# Patient Record
Sex: Male | Born: 1960 | Race: White | Hispanic: No | Marital: Single | Smoking: Current every day smoker
Health system: Southern US, Community
[De-identification: ages and names within clinical notes are randomized; demographics above are authoritative.]

## PROBLEM LIST (undated history)

## (undated) DIAGNOSIS — I10 Essential (primary) hypertension: Secondary | ICD-10-CM

## (undated) DIAGNOSIS — I639 Cerebral infarction, unspecified: Secondary | ICD-10-CM

## (undated) DIAGNOSIS — J449 Chronic obstructive pulmonary disease, unspecified: Secondary | ICD-10-CM

## (undated) DIAGNOSIS — F1910 Other psychoactive substance abuse, uncomplicated: Secondary | ICD-10-CM

## (undated) HISTORY — PX: APPENDECTOMY: SHX54

---

## 2005-08-07 ENCOUNTER — Emergency Department: Payer: Self-pay | Admitting: Emergency Medicine

## 2008-10-25 ENCOUNTER — Inpatient Hospital Stay: Payer: Self-pay | Admitting: Internal Medicine

## 2008-12-05 ENCOUNTER — Emergency Department: Payer: Self-pay | Admitting: Emergency Medicine

## 2009-11-15 ENCOUNTER — Encounter: Payer: Self-pay | Admitting: Family Medicine

## 2009-11-26 ENCOUNTER — Encounter: Payer: Self-pay | Admitting: Family Medicine

## 2009-12-02 ENCOUNTER — Inpatient Hospital Stay: Payer: Self-pay | Admitting: Internal Medicine

## 2009-12-26 ENCOUNTER — Encounter: Payer: Self-pay | Admitting: Family Medicine

## 2010-01-26 ENCOUNTER — Encounter: Payer: Self-pay | Admitting: Family Medicine

## 2010-02-26 ENCOUNTER — Encounter: Payer: Self-pay | Admitting: Family Medicine

## 2011-01-22 ENCOUNTER — Emergency Department: Payer: Self-pay | Admitting: *Deleted

## 2012-11-09 ENCOUNTER — Emergency Department: Payer: Self-pay

## 2012-11-09 LAB — DRUG SCREEN, URINE
Barbiturates, Ur Screen: NEGATIVE (ref ?–200)
Methadone, Ur Screen: NEGATIVE (ref ?–300)
Opiate, Ur Screen: NEGATIVE (ref ?–300)
Tricyclic, Ur Screen: NEGATIVE (ref ?–1000)

## 2012-11-09 LAB — HEPATIC FUNCTION PANEL A (ARMC)
Albumin: 3.9 g/dL (ref 3.4–5.0)
Bilirubin, Direct: <0.1 mg/dL (ref 0.00–0.20)
Bilirubin,Total: 0.3 mg/dL (ref 0.2–1.0)
SGPT (ALT): 36 U/L (ref 12–78)
Total Protein: 6.7 g/dL (ref 6.4–8.2)

## 2012-11-09 LAB — CBC
HCT: 48.1 % (ref 40.0–52.0)
MCH: 31 pg (ref 26.0–34.0)
MCHC: 34.9 g/dL (ref 32.0–36.0)
RBC: 5.42 10*6/uL (ref 4.40–5.90)
RDW: 13.9 % (ref 11.5–14.5)

## 2012-11-09 LAB — BASIC METABOLIC PANEL
Calcium, Total: 8.6 mg/dL (ref 8.5–10.1)
Chloride: 108 mmol/L — ABNORMAL HIGH (ref 98–107)
Creatinine: 1.09 mg/dL (ref 0.60–1.30)
EGFR (African American): >60
EGFR (Non-African Amer.): >60
Glucose: 112 mg/dL — ABNORMAL HIGH (ref 65–99)
Sodium: 137 mmol/L (ref 136–145)

## 2012-11-09 LAB — URINALYSIS, COMPLETE
Bilirubin,UR: NEGATIVE
Glucose,UR: NEGATIVE mg/dL (ref 0–75)
Ketone: NEGATIVE
Leukocyte Esterase: NEGATIVE
Nitrite: NEGATIVE
Ph: 6 (ref 4.5–8.0)
Specific Gravity: 1.025 (ref 1.003–1.030)
WBC UR: 1 /HPF (ref 0–5)

## 2012-11-09 LAB — TROPONIN I: Troponin-I: <0.02 ng/mL

## 2012-11-09 LAB — ETHANOL: Ethanol: <3 mg/dL

## 2014-01-16 ENCOUNTER — Inpatient Hospital Stay: Payer: Self-pay | Admitting: Orthopedic Surgery

## 2014-01-16 LAB — BASIC METABOLIC PANEL
Anion Gap: 7 (ref 7–16)
BUN: 14 mg/dL (ref 7–18)
CALCIUM: 8.8 mg/dL (ref 8.5–10.1)
CO2: 25 mmol/L (ref 21–32)
Chloride: 106 mmol/L (ref 98–107)
Creatinine: 1.31 mg/dL — ABNORMAL HIGH (ref 0.60–1.30)
EGFR (African American): >60
EGFR (Non-African Amer.): >60
GLUCOSE: 120 mg/dL — AB (ref 65–99)
OSMOLALITY: 277 (ref 275–301)
Potassium: 3.9 mmol/L (ref 3.5–5.1)
Sodium: 138 mmol/L (ref 136–145)

## 2014-01-16 LAB — CBC WITH DIFFERENTIAL/PLATELET
BASOS PCT: 1.2 %
Basophil #: 0.2 10*3/uL — ABNORMAL HIGH (ref 0.0–0.1)
EOS PCT: 0.7 %
Eosinophil #: 0.1 10*3/uL (ref 0.0–0.7)
HCT: 51.1 % (ref 40.0–52.0)
HGB: 17.1 g/dL (ref 13.0–18.0)
LYMPHS ABS: 1.8 10*3/uL (ref 1.0–3.6)
Lymphocyte %: 13.5 %
MCH: 30.7 pg (ref 26.0–34.0)
MCHC: 33.4 g/dL (ref 32.0–36.0)
MCV: 92 fL (ref 80–100)
MONO ABS: 1 x10 3/mm (ref 0.2–1.0)
Monocyte %: 7.3 %
NEUTROS ABS: 10.4 10*3/uL — AB (ref 1.4–6.5)
Neutrophil %: 77.3 %
PLATELETS: 227 10*3/uL (ref 150–440)
RBC: 5.56 10*6/uL (ref 4.40–5.90)
RDW: 13.5 % (ref 11.5–14.5)
WBC: 13.5 10*3/uL — ABNORMAL HIGH (ref 3.8–10.6)

## 2014-01-16 LAB — APTT: Activated PTT: 33.2 secs (ref 23.6–35.9)

## 2014-01-16 LAB — PROTIME-INR
INR: 1
PROTHROMBIN TIME: 13.3 s (ref 11.5–14.7)

## 2014-01-17 LAB — DRUG SCREEN, URINE
Amphetamines, Ur Screen: NEGATIVE (ref ?–1000)
Barbiturates, Ur Screen: NEGATIVE (ref ?–200)
Benzodiazepine, Ur Scrn: NEGATIVE (ref ?–200)
COCAINE METABOLITE, UR ~~LOC~~: POSITIVE (ref ?–300)
Cannabinoid 50 Ng, Ur ~~LOC~~: NEGATIVE (ref ?–50)
MDMA (Ecstasy)Ur Screen: NEGATIVE (ref ?–500)
METHADONE, UR SCREEN: NEGATIVE (ref ?–300)
Opiate, Ur Screen: POSITIVE (ref ?–300)
Phencyclidine (PCP) Ur S: NEGATIVE (ref ?–25)
TRICYCLIC, UR SCREEN: NEGATIVE (ref ?–1000)

## 2014-01-18 LAB — BASIC METABOLIC PANEL
Anion Gap: 7 (ref 7–16)
BUN: 8 mg/dL (ref 7–18)
CHLORIDE: 105 mmol/L (ref 98–107)
Calcium, Total: 8.2 mg/dL — ABNORMAL LOW (ref 8.5–10.1)
Co2: 28 mmol/L (ref 21–32)
Creatinine: 1.11 mg/dL (ref 0.60–1.30)
EGFR (African American): >60
EGFR (Non-African Amer.): >60
Glucose: 124 mg/dL — ABNORMAL HIGH (ref 65–99)
Osmolality: 279 (ref 275–301)
POTASSIUM: 4.1 mmol/L (ref 3.5–5.1)
Sodium: 140 mmol/L (ref 136–145)

## 2014-01-19 LAB — BASIC METABOLIC PANEL
Anion Gap: 4 — ABNORMAL LOW (ref 7–16)
BUN: 11 mg/dL (ref 7–18)
CHLORIDE: 104 mmol/L (ref 98–107)
Calcium, Total: 8.3 mg/dL — ABNORMAL LOW (ref 8.5–10.1)
Co2: 29 mmol/L (ref 21–32)
Creatinine: 1.29 mg/dL (ref 0.60–1.30)
EGFR (Non-African Amer.): >60
GLUCOSE: 135 mg/dL — AB (ref 65–99)
OSMOLALITY: 275 (ref 275–301)
Potassium: 3.8 mmol/L (ref 3.5–5.1)
Sodium: 137 mmol/L (ref 136–145)

## 2014-01-19 LAB — PLATELET COUNT: PLATELETS: 184 10*3/uL (ref 150–440)

## 2014-01-20 LAB — DRUG SCREEN, URINE
Amphetamines, Ur Screen: NEGATIVE (ref ?–1000)
BENZODIAZEPINE, UR SCRN: NEGATIVE (ref ?–200)
Barbiturates, Ur Screen: NEGATIVE (ref ?–200)
COCAINE METABOLITE, UR ~~LOC~~: NEGATIVE (ref ?–300)
Cannabinoid 50 Ng, Ur ~~LOC~~: NEGATIVE (ref ?–50)
MDMA (Ecstasy)Ur Screen: NEGATIVE (ref ?–500)
Methadone, Ur Screen: NEGATIVE (ref ?–300)
Opiate, Ur Screen: POSITIVE (ref ?–300)
PHENCYCLIDINE (PCP) UR S: NEGATIVE (ref ?–25)
Tricyclic, Ur Screen: NEGATIVE (ref ?–1000)

## 2014-01-22 LAB — BASIC METABOLIC PANEL
Anion Gap: 5 — ABNORMAL LOW (ref 7–16)
BUN: 15 mg/dL (ref 7–18)
Calcium, Total: 8.4 mg/dL — ABNORMAL LOW (ref 8.5–10.1)
Chloride: 110 mmol/L — ABNORMAL HIGH (ref 98–107)
Co2: 24 mmol/L (ref 21–32)
Creatinine: 0.95 mg/dL (ref 0.60–1.30)
EGFR (African American): >60
EGFR (Non-African Amer.): >60
Glucose: 101 mg/dL — ABNORMAL HIGH (ref 65–99)
Osmolality: 279 (ref 275–301)
Potassium: 4 mmol/L (ref 3.5–5.1)
SODIUM: 139 mmol/L (ref 136–145)

## 2014-01-22 LAB — CBC WITH DIFFERENTIAL/PLATELET
BASOS ABS: 0.1 10*3/uL (ref 0.0–0.1)
Basophil %: 1 %
EOS ABS: 0.2 10*3/uL (ref 0.0–0.7)
Eosinophil %: 2.1 %
HCT: 44.8 % (ref 40.0–52.0)
HGB: 15 g/dL (ref 13.0–18.0)
Lymphocyte #: 1.7 10*3/uL (ref 1.0–3.6)
Lymphocyte %: 15.2 %
MCH: 30.7 pg (ref 26.0–34.0)
MCHC: 33.5 g/dL (ref 32.0–36.0)
MCV: 92 fL (ref 80–100)
MONOS PCT: 11.7 %
Monocyte #: 1.3 x10 3/mm — ABNORMAL HIGH (ref 0.2–1.0)
Neutrophil #: 7.6 10*3/uL — ABNORMAL HIGH (ref 1.4–6.5)
Neutrophil %: 70 %
PLATELETS: 219 10*3/uL (ref 150–440)
RBC: 4.89 10*6/uL (ref 4.40–5.90)
RDW: 13.3 % (ref 11.5–14.5)
WBC: 10.9 10*3/uL — AB (ref 3.8–10.6)

## 2014-05-19 NOTE — Consult Note (Signed)
Brief Consult Note: Diagnosis: hypertensive urgency, left trimalleolar fracture.   Patient was seen by consultant.   Consult note dictated.   Recommend to proceed with surgery or procedure.   Orders entered.   Comments: 53yCF pmh htn, med no compliance p/w 01/16/14 after mech fall,  found to have left trimalleolar fracture markedly elevated BP  1. preop eval: orif trimalleolar fracture: moderate risk for moderate risk surgery. METs <4, limited by mobility issues, no active cp, chf, valvular dysfinction, arrhythmia: given BP - goal <180/100 prior to surgery 2. hypertensive urgency: restart lisinopril, add prn hydralazine 3. vte px: svd full 1553m.  Electronic Signatures: Izzak Fries, Cletis Athensavid K (MD)  (Signed 22-Dec-15 23:45)  Authored: Brief Consult Note   Last Updated: 22-Dec-15 23:45 by Wyatt HasteHower, Jaimin Krupka K (MD)

## 2014-05-19 NOTE — Consult Note (Signed)
PATIENT NAME:  Steven [name], Steven [name] DATE OF BIRTH:  06/08/1960  DATE OF CONSULTATION:  01/16/2014  REFERRING PHYSICIAN:  Leitha SchullerMichael J. Menz, MD  CONSULTING PHYSICIAN:  Steven Athensavid K. Hower, MD  REASON FOR CONSULTATION:  Preoperative evaluation.   HISTORY OF PRESENT ILLNESS:  This is a 54 year old Caucasian gentleman with a history of hypertension and poor medical compliance, as he is homeless, takes no medications, presenting on 01/16/2014, after a mechanical fall. He was found to have left-sided trimalleolar fracture and also noted to have markedly elevated blood pressure. He originally fell after slipping on some mud outside and noticed immediate pain over his left ankle, 10 out of 10 in intensity, sharp in quality, worse with walking, and no relieving factors. He was evaluated by the orthopedic service, who actually admitted him and asked for a medical consult for preoperative evaluation.   REVIEW OF SYSTEMS: CONSTITUTIONAL:  Denies fevers, chills, fatigue, or weakness.  EYES:  Denies blurred vision, double vision, or eye pain.  EARS, NOSE, AND THROAT:  Denies tinnitus, ear pain, or hearing loss. RESPIRATORY:  Denies cough, wheeze, or shortness of breath.  CARDIOVASCULAR:  Denies chest pain, palpitations, or edema.  GASTROINTESTINAL:  Denies nausea, vomiting, diarrhea, or abdominal pain.  GENITOURINARY:  Denies dysuria or hematuria.  ENDOCRINE:  Denies nocturia or thyroid problems.  HEMATOLOGIC AND LYMPHATIC:  Denies easy bruising or bleeding. SKIN:  Denies rash or lesions. MUSCULOSKELETAL:  Positive for pain in the left ankle as described above. Otherwise, denies any pain in the neck, back, shoulder, knees, or hips or arthritic symptoms.  NEUROLOGIC:  Denies any paralysis or paresthesias.  PSYCHIATRIC:  Denies anxiety or depressive symptoms.  Otherwise, a full review of systems performed by me is negative.  PAST MEDICAL HISTORY:  CVA with residual ambulatory dysfunction and  essential hypertension, which he is on no medications.   SOCIAL HISTORY:  Positive for everyday tobacco use. Denies any alcohol or drug use.   FAMILY HISTORY:  Positive for diabetes as well as coronary artery disease.   ALLERGIES:  FELDENE AS WELL AS NONSTEROIDAL ANTI-INFLAMMATORY DRUGS.   HOME MEDICATIONS:  Include none. He was previously on lisinopril 20 mg daily, but had stopped taking this a number of months ago.   PHYSICAL EXAMINATION: VITAL SIGNS:  Temperature 99.3, heart rate 100, respirations 18, blood pressure 173/112, saturating 94% on room air. Weight 73.2 kg, BMI 27.7.  GENERAL:  Somewhat disheveled Caucasian gentleman, currently in minimal distress given ankle pain.  HEAD:  Normocephalic, atraumatic.  EYES:  Pupils are equal, round, and reactive to light. Extraocular muscles are intact. No scleral icterus.  MOUTH:  Moist mucosal membranes. Dentition is intact. No abscess noted.  EARS, NOSE, AND THROAT:  Clear without exudates. No external lesions. NECK:  Supple. No thyromegaly. No nodules. No JVD.  PULMONARY:  Clear to auscultation bilaterally without wheezes, rales, or rhonchi. No use of accessory muscles. Good respiratory effort.  CHEST:  Nontender to palpation.  CARDIOVASCULAR:  S1 and S2, regular rate and rhythm. No murmurs, rubs, or gallops. No edema. Pedal pulses are 2+ bilaterally.  GASTROINTESTINAL:  Soft, nontender, nondistended. No masses. Positive bowel sounds. No hepatosplenomegaly. MUSCULOSKELETAL:  There is focal swelling and edema over the left ankle without any ecchymosis. Range of motion is limited in his left ankle secondary to pain; otherwise, range of motion is full in all extremities.  NEUROLOGIC:  Cranial nerves II through XII are intact. No gross focal neurological deficits. Sensation is intact. Reflexes are  intact.  SKIN:  No ulceration, lesions, rash, or cyanosis. Skin is warm and dry. Turgor is intact.  PSYCHIATRIC:  Mood and affect are within normal  limits. The patient is awake, alert, and oriented x 3. Insight and judgment are intact.   LABORATORY DATA:  He had an x-ray of the left ankle revealing acute trimalleolar fracture with mid-lateral subluxation. CT of the left ankle without contrast reveals slightly comminuted, mildly displaced trimalleolar fracture with partial disruption of the interosseous ligament and medial widening of the ankle mortise.   Remainder of laboratory data:  Sodium 130, potassium 3.9, chloride 106, bicarbonate 25, BUN 14, creatinine 1.31, glucose 120. WBC is 13.5, hemoglobin 17.1, and platelets are 227,000. INR is 1.   ASSESSMENT AND PLAN:  This is a 54 year old Caucasian gentleman with history of hypertension, he is on no medications, presenting after a mechanical fall and found to have a left trimalleolar fracture. Medical consult is requested for high blood pressure as well as preoperative evaluation.   1.  Preoperative evaluation for open reduction and internal fixation of left trimalleolar fracture. He should be considered moderate risk for moderate risk surgery from a cardiac standpoint, has METs less than 4 given limited mobility at baseline. He, however, denies any active chest pain or anginal symptoms. No evidence of congestive heart failure, valvular dysfunction, or significant arrhythmias; however, given elevated blood pressure, we will ideally have blood pressure less than 180/100 prior to surgery. As far as medications are concerned, we will restart his prior home medications including lisinopril, provide pain control and a bowel regimen.  2.  Hypertensive urgency. Restart his lisinopril. Add p.r.n. hydralazine as needed for systolic greater than 180 and diastolic greater than 100.  3.  Venous thromboembolism prophylaxis with sequential compression devices.  CODE STATUS:  The patient is a full code.   TIME SPENT:  45 minutes.    ____________________________ Steven [name]. Hower, MD dkh:nb D: 01/16/2014  23:53:32 ET T: 01/17/2014 00:41:10 ET JOB#: [id number]  cc: Steven [name]. Hower, MD, <Dictator> Steven Synetta Shadow MD ELECTRONICALLY SIGNED 01/17/2014 1:49

## 2014-05-19 NOTE — Op Note (Signed)
PATIENT NAME:  Steven SkeetersWELCHER, Clearnce T MR#:  [id number] DATE OF BIRTH:  [birth year]  DATE OF PROCEDURE:  01/20/2014  PREOPERATIVE DIAGNOSIS: Left trimalleolar ankle fracture.   POSTOPERATIVE DIAGNOSIS:  Left trimalleolar ankle fracture.  PROCEDURE: ORIF left medial malleolus and lateral malleolus.   ANESTHESIA: General.   SURGEON: Kennedy BuckerMichael Elah Avellino, MD   DESCRIPTION OF PROCEDURE: The patient was brought to the Operating Room and after adequate anesthesia was obtained, a bump was placed underneath the left buttock. The leg was prepped and draped in the usual sterile fashion with a tourniquet applied to the upper thigh. After patient identification and timeout procedures were completed, the tourniquet was raised.  Lateral approach was made to the distal fibula with exposure of the fracture site.  A 7-hole third tubular plate was contoured to fit the distal fibula.  Proximal screws placed with bicortical fixation of the fibula. This gave good reduction to the shaft fracture. Two syndesmotic screws were placed to aid in maintenance of reduction of the syndesmosis with an additional distal fully threaded cancellous screw in the lateral malleolus.  The fracture was very well aligned at this point with anatomic reduction of the mortise based on intraoperative x-rays.  The wound was thoroughly irrigated and then closed with 3-0 Vicryl subcutaneously and skin staples. Attention was then turned medially and medial approach was made with the fracture site identified. This was drilled, and a 35 mm malleolar screw was inserted which gave good fixation with compression at the fracture site, essentially anatomically reduced. This wound was then irrigated and closed. Permanent x-rays to be taken to the recovery room.  The wound was closed with 3-0 Vicryl and skin staples. Xeroform, 4 x 4, Webril and a short leg cast were applied with the ankle in neutral. Tourniquet was let down at the close of the case. The patient was sent to the  recovery room in stable condition.   ESTIMATED BLOOD LOSS: Minimal.   COMPLICATIONS: None.   SPECIMEN: None.   IMPLANTS:  Biomet small frag, third tubular plate with multiple screws and 35 mm malleolar screw medially.   TOURNIQUET TIME:  40 minutes at 300 mmHg.  COMPLICATIONS:  There were no complications.   SPECIMEN: No specimen.   CONDITION:  To recovery room, stable.     ____________________________ Leitha SchullerMichael J. Andranik Jeune, MD mjm:DT D: 01/20/2014 10:15:22 ET T: 01/20/2014 10:38:15 ET JOB#: [id number]  cc: Leitha SchullerMichael J. Yunis Voorheis, MD, <Dictator> Leitha SchullerMICHAEL J Patsey Pitstick MD ELECTRONICALLY SIGNED 01/21/2014 11:40

## 2014-05-19 NOTE — Consult Note (Signed)
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   Patient was seen by consultant.   Consult note dictated.   Comments: Psychiatry: PAtient seen and chart reviewed and consult dictated. PAtient admits to cocaine abuse but has no intention of stopping despite knowing the dangers. Denies drinking. No evidence of any current withdrawl. No evidence psychosis and denies depression. Clearly has a history of living unsafely but says he prefers it despite the risks. Not commitable.   No futher treatment. Family may want to pursue incompetancy hearing at some point if he continues to take poor care of himself.  Electronic Signatures: Clapacs, Jackquline DenmarkJohn T (MD)  (Signed 29-Dec-15 11:25)  Authored: Brief Consult Note   Last Updated: 29-Dec-15 11:25 by Audery Amellapacs, John T (MD)

## 2014-05-19 NOTE — H&P (Signed)
PATIENT NAME:  Steven SkeetersWELCHER, Markie T MR#:  [id number] DATE OF BIRTH:  01/16/1961  DATE OF ADMISSION:  01/16/2014  CHIEF COMPLAINT: Left ankle pain and deformity.   HISTORY OF PRESENT ILLNESS: The patient is a 54 year old who was on a slope. His foot slipped in the mud and he fell over his ankle. He says it twisted backwards. He was unable to bear weight, and came to the Emergency Room where he was found to have a left ankle trimalleolar fracture. His skin was intact. He was splinted and admitted for treatment of this condition.   He is homeless. He normally walks without assistive device. He does not have regular medical care, despite significant health problems.   PAST MEDICAL HISTORY: Remarkable for prior CVA x 3, hypertension, history of substance abuse, reflux, alcohol abuse.   REVIEW OF SYSTEMS: Positive just for the left ankle pain. He does report some generalized weakness secondary to his prior CVAs.   PHYSICAL EXAMINATION:  GENERAL: Well-developed, well-nourished white male who appears much older than his stated age, in mild distress.  HEENT: Remarkable for him being edentulous.  LUNGS: Clear.  HEART: Regular rate and rhythm.  ABDOMEN: Soft and nontender.  EXTREMITIES: Exam is more pertinent to left lower extremity. He has brisk capillary refill. He is in a splint. He is able to flex and extend the toes, and sensation is intact.   The x-rays and CT confirm trimalleolar ankle fracture with comminution of the fibula and widening of the ankle mortise.   CLINICAL IMPRESSION: Left ankle trimalleolar fracture.   RECOMMENDATION: For open reduction and internal fixation of the medial and lateral malleoli with syndesmotic screw. The risks, benefits, and possible complications were discussed with the patient.   He was very hypertensive when he came in, but that has been normalized with the assistance of the hospitalist.   We will plan on surgery later, sometime on 01/17/2014. I do hope we can  make arrangements for a rehab stay as, if he is discharged to the street, I think it is likely that he will have wound complications.    ____________________________ Leitha SchullerMichael J. Jasline Buskirk, MD mjm:MT D: 01/17/2014 08:01:06 ET T: 01/17/2014 09:02:17 ET JOB#: [id number]  cc: Leitha SchullerMichael J. Kailen Name, MD, <Dictator> Leitha SchullerMICHAEL J Felisha Claytor MD ELECTRONICALLY SIGNED 01/17/2014 12:16

## 2014-05-27 NOTE — Discharge Summary (Signed)
PATIENT NAME:  Steven [name], Steven [name] MR#:  [id number] DATE OF BIRTH:  1960/10/31  DATE OF ADMISSION:  01/16/2014 DATE OF DISCHARGE:  01/23/2014  ADMITTING DIAGNOSIS: Left ankle trimalleolar fracture.   DISCHARGE DIAGNOSIS: Left ankle trimalleolar fracture.   PROCEDURE: Open reduction and internal fixation of left medial malleolus and lateral malleolus.   ANESTHESIA: General.   SURGEON: Leitha SchullerMichael J. Menz, MD   ESTIMATED BLOOD LOSS: Minimal.   COMPLICATIONS: None.   IMPLANTS: Biomet, small fragment, one third tubular plate with multiple screws and 35 mm malleolus screws medially.   TOURNIQUET TIME: 40 minutes at 300 mmHg.   HISTORY OF PRESENT ILLNESS: The patient is a 54 year old who was on his float. His foot slipped in the mud, and he fell over his ankle. He said it twisted backwards. He was unable to bear weight and came to the Emergency Room, where he was found to have a left ankle bimalleolar fracture. Skin was intact. He was splinted and admitted for treatment of this condition.    PHYSICAL EXAMINATION:  GENERAL: Well-developed, well-nourished white male, appears much older than his stated age.  HEENT: Remarkable for him being edentulous.  LUNGS: Clear.  HEART: Regular rate and rhythm.  ABDOMEN: Soft and nontender.  EXTREMITIES: Exam is more pertinent to the left lower extremity. He has brisk capillary refill. He is in a splint. He is able to flex and extend the toes, and sensation is intact.   HOSPITAL COURSE: The patient was admitted to the hospital on 01/16/2014. He presented to the ER. The patient was homeless. He was found to have hypertension and a left-sided trimalleolar ankle fracture.   The patient was evaluated by medicine and he was started on lisinopril and hydralazine p.r.n. hypertension. On 01/17/2014, he was evaluated by orthopedics, where it was determined he would need an ORIF of the left ankle. Urine drug screen was positive for cocaine, so this delayed surgery until  01/20/2014.   The patient was started with physical therapy. He was nonweightbearing on the left lower extremity. On 01/18/2014, the patient was doing well. Pain was controlled. He was having some symptoms consistent with reflux, so he was started on proton pump inhibitor. His hypertension continued, so metoprolol was added. On 01/20/2014, the patient underwent ORIF for a left trimalleolar ankle fracture. On 01/21/2014, the patient did suffer some delirium, which was thought to be from alcohol withdrawals. He was given fluids, potassium and thiamine, and this did improve.   By 01/22/2014, his withdrawal symptoms were improved and he was not having any delirium. Hypertension had resolved. On 01/23/2014, the patient was scheduled for discharge to home with home health.   CONDITION AT DISCHARGE: Stable.   DISCHARGE INSTRUCTIONS: The patient should not put any weight on the affected extremity. Elevate the affected foot or leg on 1-2 pillows so that it is higher than the knee. The patient may resume a regular diet as tolerated. Keep the cast clean and dry. [facility]. The patient has a followup appointment. He was told not to do cocaine or crack. The patient should follow up with Dr. Rosita KeaMenz.   DISCHARGE MEDICATIONS: Please see list of discharge medications on discharge instructions.    ____________________________ [name]. Cranston Neighborhris Gaines, PA-C tcg:MT D: 01/30/2014 17:15:56 ET [name]: 01/30/2014 17:51:35 ET JOB#: [id number]  cc: [name]. Cranston Neighborhris Gaines, PA-C, <Dictator> Evon SlackHOMAS C GAINES GeorgiaPA ELECTRONICALLY SIGNED 02/06/2014 8:31

## 2016-04-14 ENCOUNTER — Emergency Department: Payer: Medicaid Other

## 2016-04-14 ENCOUNTER — Observation Stay: Payer: Medicaid Other

## 2016-04-14 ENCOUNTER — Inpatient Hospital Stay
Admission: EM | Admit: 2016-04-14 | Discharge: 2016-04-17 | DRG: 065 | Disposition: A | Discharge status: Home / Self Care | Payer: Medicaid Other | Attending: Internal Medicine | Admitting: Internal Medicine

## 2016-04-14 DIAGNOSIS — G253 Myoclonus: Secondary | ICD-10-CM | POA: Diagnosis present

## 2016-04-14 DIAGNOSIS — E785 Hyperlipidemia, unspecified: Secondary | ICD-10-CM | POA: Diagnosis present

## 2016-04-14 DIAGNOSIS — K224 Dyskinesia of esophagus: Secondary | ICD-10-CM | POA: Diagnosis present

## 2016-04-14 DIAGNOSIS — R079 Chest pain, unspecified: Secondary | ICD-10-CM

## 2016-04-14 DIAGNOSIS — Z66 Do not resuscitate: Secondary | ICD-10-CM | POA: Diagnosis present

## 2016-04-14 DIAGNOSIS — Z7189 Other specified counseling: Secondary | ICD-10-CM

## 2016-04-14 DIAGNOSIS — R7989 Other specified abnormal findings of blood chemistry: Secondary | ICD-10-CM

## 2016-04-14 DIAGNOSIS — X58XXXA Exposure to other specified factors, initial encounter: Secondary | ICD-10-CM | POA: Diagnosis not present

## 2016-04-14 DIAGNOSIS — R000 Tachycardia, unspecified: Secondary | ICD-10-CM | POA: Diagnosis present

## 2016-04-14 DIAGNOSIS — Z888 Allergy status to other drugs, medicaments and biological substances status: Secondary | ICD-10-CM

## 2016-04-14 DIAGNOSIS — F1410 Cocaine abuse, uncomplicated: Secondary | ICD-10-CM | POA: Diagnosis present

## 2016-04-14 DIAGNOSIS — Z590 Homelessness: Secondary | ICD-10-CM

## 2016-04-14 DIAGNOSIS — Z9114 Patient's other noncompliance with medication regimen: Secondary | ICD-10-CM

## 2016-04-14 DIAGNOSIS — Z9189 Other specified personal risk factors, not elsewhere classified: Secondary | ICD-10-CM

## 2016-04-14 DIAGNOSIS — I2510 Atherosclerotic heart disease of native coronary artery without angina pectoris: Secondary | ICD-10-CM | POA: Diagnosis present

## 2016-04-14 DIAGNOSIS — T17998A Other foreign object in respiratory tract, part unspecified causing other injury, initial encounter: Secondary | ICD-10-CM | POA: Diagnosis not present

## 2016-04-14 DIAGNOSIS — I10 Essential (primary) hypertension: Secondary | ICD-10-CM | POA: Diagnosis present

## 2016-04-14 DIAGNOSIS — E860 Dehydration: Secondary | ICD-10-CM

## 2016-04-14 DIAGNOSIS — I63412 Cerebral infarction due to embolism of left middle cerebral artery: Principal | ICD-10-CM | POA: Diagnosis present

## 2016-04-14 DIAGNOSIS — Z9119 Patient's noncompliance with other medical treatment and regimen: Secondary | ICD-10-CM

## 2016-04-14 DIAGNOSIS — J441 Chronic obstructive pulmonary disease with (acute) exacerbation: Secondary | ICD-10-CM | POA: Diagnosis present

## 2016-04-14 DIAGNOSIS — R778 Other specified abnormalities of plasma proteins: Secondary | ICD-10-CM

## 2016-04-14 DIAGNOSIS — D72829 Elevated white blood cell count, unspecified: Secondary | ICD-10-CM | POA: Diagnosis present

## 2016-04-14 DIAGNOSIS — Z79899 Other long term (current) drug therapy: Secondary | ICD-10-CM

## 2016-04-14 DIAGNOSIS — Y92230 Patient room in hospital as the place of occurrence of the external cause: Secondary | ICD-10-CM | POA: Diagnosis not present

## 2016-04-14 DIAGNOSIS — N289 Disorder of kidney and ureter, unspecified: Secondary | ICD-10-CM | POA: Diagnosis present

## 2016-04-14 DIAGNOSIS — I639 Cerebral infarction, unspecified: Secondary | ICD-10-CM

## 2016-04-14 DIAGNOSIS — Z515 Encounter for palliative care: Secondary | ICD-10-CM | POA: Diagnosis present

## 2016-04-14 DIAGNOSIS — R066 Hiccough: Secondary | ICD-10-CM | POA: Diagnosis present

## 2016-04-14 DIAGNOSIS — R29898 Other symptoms and signs involving the musculoskeletal system: Secondary | ICD-10-CM

## 2016-04-14 DIAGNOSIS — R1310 Dysphagia, unspecified: Secondary | ICD-10-CM

## 2016-04-14 DIAGNOSIS — F17210 Nicotine dependence, cigarettes, uncomplicated: Secondary | ICD-10-CM | POA: Diagnosis present

## 2016-04-14 DIAGNOSIS — I248 Other forms of acute ischemic heart disease: Secondary | ICD-10-CM | POA: Diagnosis present

## 2016-04-14 DIAGNOSIS — I252 Old myocardial infarction: Secondary | ICD-10-CM

## 2016-04-14 DIAGNOSIS — Z8249 Family history of ischemic heart disease and other diseases of the circulatory system: Secondary | ICD-10-CM

## 2016-04-14 DIAGNOSIS — G458 Other transient cerebral ischemic attacks and related syndromes: Secondary | ICD-10-CM | POA: Diagnosis present

## 2016-04-14 DIAGNOSIS — R200 Anesthesia of skin: Secondary | ICD-10-CM

## 2016-04-14 DIAGNOSIS — I69354 Hemiplegia and hemiparesis following cerebral infarction affecting left non-dominant side: Secondary | ICD-10-CM

## 2016-04-14 HISTORY — DX: Cerebral infarction, unspecified: I63.9

## 2016-04-14 HISTORY — DX: Essential (primary) hypertension: I10

## 2016-04-14 LAB — TROPONIN I
TROPONIN I: 0.03 ng/mL — AB (ref <0.03)
Troponin I: 0.04 ng/mL (ref <0.03)
Troponin I: 0.04 ng/mL (ref <0.03)

## 2016-04-14 LAB — CBC
HCT: 54.5 % — ABNORMAL HIGH (ref 40.0–52.0)
HEMOGLOBIN: 19.1 g/dL — AB (ref 13.0–18.0)
MCH: 30.8 pg (ref 26.0–34.0)
MCHC: 35.1 g/dL (ref 32.0–36.0)
MCV: 87.8 fL (ref 80.0–100.0)
Platelets: 227 10*3/uL (ref 150–440)
RBC: 6.21 MIL/uL — AB (ref 4.40–5.90)
RDW: 14 % (ref 11.5–14.5)
WBC: 13 10*3/uL — AB (ref 3.8–10.6)

## 2016-04-14 LAB — BASIC METABOLIC PANEL
Anion gap: 11 (ref 5–15)
BUN: 24 mg/dL — AB (ref 6–20)
CHLORIDE: 103 mmol/L (ref 101–111)
CO2: 25 mmol/L (ref 22–32)
Calcium: 9.8 mg/dL (ref 8.9–10.3)
Creatinine, Ser: 1.36 mg/dL — ABNORMAL HIGH (ref 0.61–1.24)
GFR calc Af Amer: >60 mL/min (ref >60)
GFR calc non Af Amer: 57 mL/min — ABNORMAL LOW (ref >60)
GLUCOSE: 156 mg/dL — AB (ref 65–99)
POTASSIUM: 3.7 mmol/L (ref 3.5–5.1)
Sodium: 139 mmol/L (ref 135–145)

## 2016-04-14 LAB — HEPATIC FUNCTION PANEL
ALBUMIN: 4.5 g/dL (ref 3.5–5.0)
ALK PHOS: 96 U/L (ref 38–126)
ALT: 14 U/L — ABNORMAL LOW (ref 17–63)
AST: 20 U/L (ref 15–41)
BILIRUBIN TOTAL: 1 mg/dL (ref 0.3–1.2)
Bilirubin, Direct: 0.2 mg/dL (ref 0.1–0.5)
Indirect Bilirubin: 0.8 mg/dL (ref 0.3–0.9)
Total Protein: 8.1 g/dL (ref 6.5–8.1)

## 2016-04-14 LAB — ETHANOL: Alcohol, Ethyl (B): <5 mg/dL (ref <5)

## 2016-04-14 LAB — CK: Total CK: 140 U/L (ref 49–397)

## 2016-04-14 MED ORDER — CHLORHEXIDINE GLUCONATE 0.12 % MT SOLN
15.0000 mL | Freq: Two times a day (BID) | OROMUCOSAL | Status: DC
  Administered 2016-04-14 – 2016-04-16 (×4): 15 mL via OROMUCOSAL
  Filled 2016-04-14 (×5): qty 15

## 2016-04-14 MED ORDER — ONDANSETRON HCL 4 MG PO TABS
4.0000 mg | ORAL_TABLET | Freq: Four times a day (QID) | ORAL | Status: DC | PRN
  Administered 2016-04-14: 4 mg via ORAL
  Filled 2016-04-14: qty 1

## 2016-04-14 MED ORDER — ASPIRIN 81 MG PO CHEW
324.0000 mg | CHEWABLE_TABLET | Freq: Once | ORAL | Status: AC
  Administered 2016-04-14: 324 mg via ORAL
  Filled 2016-04-14: qty 4

## 2016-04-14 MED ORDER — ALBUTEROL SULFATE (2.5 MG/3ML) 0.083% IN NEBU: 2.5000 mg | INHALATION_SOLUTION | RESPIRATORY_TRACT | Status: DC | PRN

## 2016-04-14 MED ORDER — LISINOPRIL 10 MG PO TABS
10.0000 mg | ORAL_TABLET | Freq: Every day | ORAL | Status: DC
  Administered 2016-04-14 – 2016-04-16 (×2): 10 mg via ORAL
  Filled 2016-04-14 (×2): qty 1

## 2016-04-14 MED ORDER — POTASSIUM CHLORIDE IN NACL 20-0.9 MEQ/L-% IV SOLN
INTRAVENOUS | Status: DC
  Administered 2016-04-14 – 2016-04-16 (×3): via INTRAVENOUS
  Filled 2016-04-14 (×8): qty 1000

## 2016-04-14 MED ORDER — ENOXAPARIN SODIUM 40 MG/0.4ML ~~LOC~~ SOLN
40.0000 mg | SUBCUTANEOUS | Status: DC
  Administered 2016-04-14 – 2016-04-16 (×3): 40 mg via SUBCUTANEOUS
  Filled 2016-04-14 (×3): qty 0.4

## 2016-04-14 MED ORDER — SODIUM CHLORIDE 0.9 % IV BOLUS (SEPSIS)
1000.0000 mL | Freq: Once | INTRAVENOUS | Status: AC
  Administered 2016-04-14: 1000 mL via INTRAVENOUS

## 2016-04-14 MED ORDER — PREDNISONE 50 MG PO TABS
50.0000 mg | ORAL_TABLET | Freq: Every day | ORAL | Status: DC
  Administered 2016-04-14: 50 mg via ORAL
  Filled 2016-04-14: qty 1

## 2016-04-14 MED ORDER — IBUPROFEN 400 MG PO TABS: 400.0000 mg | ORAL_TABLET | Freq: Four times a day (QID) | ORAL | Status: DC | PRN

## 2016-04-14 MED ORDER — POLYETHYLENE GLYCOL 3350 17 G PO PACK: 17.0000 g | PACK | Freq: Every day | ORAL | Status: DC | PRN

## 2016-04-14 MED ORDER — ATORVASTATIN CALCIUM 20 MG PO TABS
40.0000 mg | ORAL_TABLET | Freq: Every day | ORAL | Status: DC
  Administered 2016-04-16: 40 mg via ORAL
  Filled 2016-04-14 (×2): qty 2

## 2016-04-14 MED ORDER — ACETAMINOPHEN 325 MG PO TABS: 650.0000 mg | ORAL_TABLET | Freq: Four times a day (QID) | ORAL | Status: DC | PRN

## 2016-04-14 MED ORDER — SODIUM CHLORIDE 0.9% FLUSH
3.0000 mL | Freq: Two times a day (BID) | INTRAVENOUS | Status: DC
  Administered 2016-04-14 – 2016-04-16 (×5): 3 mL via INTRAVENOUS

## 2016-04-14 MED ORDER — IPRATROPIUM-ALBUTEROL 0.5-2.5 (3) MG/3ML IN SOLN: 3.0000 mL | Freq: Once | RESPIRATORY_TRACT | Status: DC

## 2016-04-14 MED ORDER — ASPIRIN EC 81 MG PO TBEC
81.0000 mg | DELAYED_RELEASE_TABLET | Freq: Every day | ORAL | Status: DC
  Administered 2016-04-16 – 2016-04-17 (×2): 81 mg via ORAL
  Filled 2016-04-14 (×2): qty 1

## 2016-04-14 MED ORDER — ONDANSETRON HCL 4 MG/2ML IJ SOLN: 4.0000 mg | Freq: Four times a day (QID) | INTRAMUSCULAR | Status: DC | PRN

## 2016-04-14 MED ORDER — ORAL CARE MOUTH RINSE
15.0000 mL | Freq: Two times a day (BID) | OROMUCOSAL | Status: DC
  Administered 2016-04-15 (×2): 15 mL via OROMUCOSAL

## 2016-04-14 MED ORDER — ACETAMINOPHEN 650 MG RE SUPP: 650.0000 mg | Freq: Four times a day (QID) | RECTAL | Status: DC | PRN

## 2016-04-14 NOTE — Evaluation (Addendum)
Clinical/Bedside Swallow Evaluation Patient Details  Name: Steven [name] MRN: [id number] Date of Birth: [birth year]  Today's Date: 04/14/2016 Time: SLP Start Time (ACUTE ONLY): 1520 SLP Stop Time (ACUTE ONLY): 1620 SLP Time Calculation (min) (ACUTE ONLY): 60 min  Past Medical History:  Past Medical History:  Diagnosis Date  . CVA (cerebral vascular accident) (HCC)   . Hypertension    Past Surgical History: History reviewed. No pertinent surgical history. HPI:  Pt is a 56 y.o. male , homeless, with a history of ongoing tobacco and cocaine abuse, history of multiple CVA with residual left-sided weakness, not taking any of his chronic medications, who called EMS for lightheadedness. The patient reports that 3 days ago he moved out of the hotel he was living in, not morning he woke up with lightheadedness associated with several episodes of vomiting which have resolved. He also has developed a cough without any fever, congestion or rhinorrhea, sore throat or ear pain. Over the past week he has had 8-10 episodes of a substernal "sharp" chest pain that mostly occur when he is sitting and resolve spontaneously. He denies any palpitations or syncope, no diaphoresis. He reports "I'm short of breath but I am not short of breath, you know what I mean?" The patient also reports that he has had acute on chronic worsening of his left face, upper extremity and lower extremity numbness and that this has caused him to fall several times. He denies any headache, visual changes, speech changes or mental status changes. No trauma. Pt indicated he does cough "sometimes" when eating/drinking but it's "worse in the past few days". Pt does have a baseline of multiple CVAs; noted lingual deviation to the Right w/ min protrusion when swallowing. Pt is experiencing increased phlegm which he attempts to cough/clear throat and expectorate. Pt presented w/ hiccups, min involuntary shakes in UEs, head, and halting speech w/  dysfluency and min dysarthria - pt is edentulous at baseline. Pt was alert and followed commands, answered basic questions re: self.    Assessment / Plan / Recommendation Clinical Impression  Pt appears at increased risk for aspiration; pt exhibited oropharyngeal phase dysphagia w/ po trials assessed today.  SLP Visit Diagnosis: Dysphagia, oropharyngeal phase (R13.12)    Aspiration Risk  Moderate aspiration risk (-severe aspiration risk)    Diet Recommendation  NPO until completion of objective swallow study(MBSS) tomorrow per MD order. Recommend frequent oral care daily while NPO.   Medication Administration: Crushed with puree (w/ NSG supervision/monitoring for toleration)    Other  Recommendations Recommended Consults:  (Dietician f/u) Oral Care Recommendations: Oral care QID;Staff/trained caregiver to provide oral care;Patient independent with oral care Other Recommendations:  (TBD)   Follow up Recommendations  (TBD) Pt may benefit from a GI consult to r/o Reflux and/or Esophageal dysmotility d/t the increased phlegm and recent episodes of Vomiting/Nausea.     Frequency and Duration min 3x week  2 weeks       Prognosis Prognosis for Safe Diet Advancement: Guarded Barriers to Reach Goals: Severity of deficits      Swallow Study   General Date of Onset: 04/14/16 HPI: Pt is a 56 y.o. male , homeless, with a history of ongoing tobacco and cocaine abuse, history of multiple CVA with residual left-sided weakness, not taking any of his chronic medications, who called EMS for lightheadedness. The patient reports that 3 days ago he moved out of the hotel he was living in, not morning he woke up with lightheadedness associated with several  episodes of vomiting which have resolved. He also has developed a cough without any fever, congestion or rhinorrhea, sore throat or ear pain. Over the past week he has had 8-10 episodes of a substernal "sharp" chest pain that mostly occur when he is  sitting and resolve spontaneously. He denies any palpitations or syncope, no diaphoresis. He reports "I'm short of breath but I am not short of breath, you know what I mean?" The patient also reports that he has had acute on chronic worsening of his left face, upper extremity and lower extremity numbness and that this has caused him to fall several times. He denies any headache, visual changes, speech changes or mental status changes. No trauma. Pt indicated he does cough "sometimes" when eating/drinking but it's "worse in the past few days". Pt does have a baseline of multiple CVAs; noted lingual deviation to the Right w/ min protrusion when swallowing. Pt is experiencing increased phlegm which he attempts to cough/clear throat and expectorate. Pt presented w/ hiccups, min involuntary shakes in UEs, head, and halting speech w/ dysfluency and min dysarthria - pt is edentulous at baseline. Pt was alert and followed commands, answered basic questions re: self.  Type of Study: Bedside Swallow Evaluation Previous Swallow Assessment: none reported by pt Diet Prior to this Study: Regular;Thin liquids (softer foods) Temperature Spikes Noted: No (wbc 13.0) Respiratory Status: Room air History of Recent Intubation: No Behavior/Cognition: Alert;Cooperative;Pleasant mood;Requires cueing;Distractible (min) Oral Cavity Assessment: Dry (thicker tongue) Oral Care Completed by SLP: Recent completion by staff Oral Cavity - Dentition: Edentulous Vision: Functional for self-feeding Self-Feeding Abilities: Able to feed self;Needs set up Patient Positioning: Upright in bed Baseline Vocal Quality: Normal Volitional Cough: Strong (exagerated; somewhat congested) Volitional Swallow: Able to elicit (slow and exagerated)    Oral/Motor/Sensory Function Overall Oral Motor/Sensory Function: Moderate impairment Facial ROM:  (grossly wfl) Facial Symmetry:  (grossly wfl) Facial Strength:  (grossly wfl) Lingual ROM: Reduced  right Lingual Symmetry: Abnormal symmetry right Lingual Strength:  (Fair) Mandible: Within Functional Limits   Ice Chips Ice chips: Not tested   Thin Liquid Thin Liquid: Impaired Presentation: Cup;Self Fed;Straw (10 trials total) Oral Phase Impairments:  (lingual deviation during swallowing) Oral Phase Functional Implications: Prolonged oral transit (lingual deviation during swallowing) Pharyngeal  Phase Impairments: Throat Clearing - Delayed;Cough - Delayed;Suspected delayed Swallow (lingual deviation during swallowing) Other Comments: pt developed pharyngeal phlegm intermittently during the swallowing/trials that did not appear related to the trials - (GI related?)    Nectar Thick Nectar Thick Liquid: Impaired Presentation: Cup;Self Fed;Straw (2 trials) Oral Phase Impairments:  (lingual deviation during swallowing) Oral phase functional implications: Prolonged oral transit (lingual deviation during swallowing) Pharyngeal Phase Impairments: Cough - Delayed (x1)   Honey Thick Honey Thick Liquid: Not tested   Puree Puree: Impaired Presentation: Spoon;Self Fed (9-10 trials) Oral Phase Impairments:  (lingual deviation during swallowing) Oral Phase Functional Implications: Prolonged oral transit (lingual deviation during swallowing) Pharyngeal Phase Impairments: Cough - Delayed (x1) Other Comments: pt developed pharyngeal phlegm intermittently during the swallowing/trials that did not appear related to the trials - (GI related?)   Solid   GO   Solid: Not tested    Functional Assessment Tool Used: clinical judgement Functional Limitations: Swallowing Swallow Current Status ([id number]): At least 60 percent but less than 80 percent impaired, limited or restricted Swallow Goal Status [phone]([id number]): At least 60 percent but less than 80 percent impaired, limited or restricted Swallow Discharge Status [phone]([id number]): At least 40 percent but less than 60 percent impaired,  limited or restricted      Jerilynn Som, MS, CCC-SLP Berna Gitto 04/14/2016,4:27 PM

## 2016-04-14 NOTE — ED Notes (Signed)
Attempted to call report

## 2016-04-14 NOTE — Progress Notes (Signed)
Patient choked on chewable aspirin with water. We'll keep patient nothing by mouth. Have speech therapy evaluate for swallowing.

## 2016-04-14 NOTE — ED Provider Notes (Addendum)
[facility] Emergency Department Provider Note  ____________________________________________  Time seen: Approximately 9:51 AM  I have reviewed the triage vital signs and the nursing notes.   HISTORY  Chief Complaint Dizziness    HPI Steven [name] is a 56 y.o. male , homeless, with a history of ongoing tobacco and cocaine abuse, history of multiple CVA with residual left-sided weakness, not taking any of his chronic medications, who called EMS for lightheadedness. The patient reports that 3 days ago he moved out of the hotel he was living in, not morning he woke up with lightheadedness associated with several episodes of vomiting which have resolved. He also has developed a cough without any fever, congestion or rhinorrhea, sore throat or ear pain. Over the past week he has had 8-10 episodes of a substernal "sharp" chest pain that mostly occur when he is sitting and resolve spontaneously. He denies any palpitations or syncope, no diaphoresis. He reports "I'm short of breath but I am not short of breath, you know what I mean?" The patient also reports that he has had acute on chronic worsening of his left face, upper extremity and lower extremity numbness and that this has caused him to fall several times. He denies any headache, visual changes, speech changes or mental status changes. No trauma.  SH: + ongoing tobacco abuse; last used cocaine within 3 wks.   History reviewed. No pertinent past medical history.  There are no active problems to display for this patient.   History reviewed. No pertinent surgical history.    Allergies Patient has no allergy information on record.  History reviewed. No pertinent family history.  Social History Social History  Substance Use Topics  . Smoking status: Current Every Day Smoker    Packs/day: 1.00    Types: Cigarettes  . Smokeless tobacco: Never Used  . Alcohol use No    Review of Systems Constitutional: No  fever/chills.Nausea of lightheadedness without dizziness. No syncope. Multiple recurrent falls. No blurred or double vision. Eyes: No visual changes. ENT: No sore throat. No congestion or rhinorrhea. Cardiovascular: Positive chest pain. Denies palpitations. Respiratory: Denies shortness of breath.  Positive cough. Gastrointestinal: No abdominal pain.  No nausea, no vomiting.  No diarrhea.  No constipation. Genitourinary: Negative for dysuria. Musculoskeletal: Negative for back pain. Skin: Negative for rash. Neurological: Negative for headaches. Positive chronic right lower extremity weakness. Positive acute on chronic left face, upper extremity and lower extremity numbness. No visual changes or speech changes.  10-point ROS otherwise negative.  ____________________________________________   PHYSICAL EXAM:  VITAL SIGNS: ED Triage Vitals  Enc Vitals Group     BP 04/14/16 0936 103/84     Pulse Rate 04/14/16 0936 (!) 108     Resp 04/14/16 0936 18     Temp 04/14/16 0936 97.9 F (36.6 C)     Temp Source 04/14/16 0936 Oral     SpO2 04/14/16 0936 94 %     Weight 04/14/16 0937 165 lb (74.8 kg)     Height 04/14/16 0937 5\' 4"  (1.626 m)     Head Circumference --      Peak Flow --      Pain Score --      Pain Loc --      Pain Edu? --      Excl. in GC? --     Constitutional: Alert and oriented 3 and answering questions appropriately. Chronically ill appearing but nontoxic. Eyes: Conjunctivae are normal.  EOMI. PERRLA. No scleral  icterus. Head: Atraumatic. Nose: No congestion/rhinnorhea. Mouth/Throat: Mucous membranes are dry..  Neck: No stridor.  Supple.  No JVD. No meningismus. Cardiovascular: Fast rate, regular rhythm. No murmurs, rubs or gallops.  Respiratory: Normal respiratory effort.  No accessory muscle use or retractions. Lungs CTAB.  No wheezes, rales or ronchi. Gastrointestinal: Soft, nontender and nondistended.  No guarding or rebound.  No peritoneal  signs. Musculoskeletal: No LE edema. No ttp in the calves or palpable cords.  Negative Homan's sign. Neurologic: Alert and oriented 3. Speech is clear. Face and smile symmetric. Tongue is midline. EOMI and PERRLA. No horizontal or vertical nystagmus.. No pronator drift. 5 out of 5 grip, biceps, triceps, plantar flexion and dorsiflexion. 4+ out of 5 right hip flexor and 5 out of 5 left hip flexor strength. Decreased sensation to light touch in the left face, arm and lower extremity. Normal finger-nose-finger without ataxia. .Skin:  Skin is warm, dry and intact. No rash noted. Psychiatric: Mood and affect are normal.   ____________________________________________   LABS (all labs ordered are listed, but only abnormal results are displayed)  Labs Reviewed  CBC - Abnormal; Notable for the following:       Result Value   WBC 13.0 (*)    RBC 6.21 (*)    Hemoglobin 19.1 (*)    HCT 54.5 (*)    All other components within normal limits  BASIC METABOLIC PANEL - Abnormal; Notable for the following:    Glucose, Bld 156 (*)    BUN 24 (*)    Creatinine, Ser 1.36 (*)    GFR calc non Af Amer 57 (*)    All other components within normal limits  TROPONIN I - Abnormal; Notable for the following:    Troponin I 0.03 (*)    All other components within normal limits  HEPATIC FUNCTION PANEL - Abnormal; Notable for the following:    ALT 14 (*)    All other components within normal limits  ETHANOL  URINALYSIS, COMPLETE (UACMP) WITH MICROSCOPIC  URINE DRUG SCREEN, QUALITATIVE ([facility] ONLY)   ____________________________________________  EKG  ED ECG REPORT I, Rockne Menghini, the attending physician, personally viewed and interpreted this ECG.   Date: 04/14/2016  EKG Time: 937  Rate: 109  Rhythm: sinus tachycardia  Axis: leftward  Intervals:none  ST&T Change: 1 mm of ST elevation in V1. Possibly peaked T waves in V2 and V3. 1 mm of ST elevation in aVR. Poor baseline tracing, we'll need to  repeat EKG.  Repeat EKG: ED ECG REPORT I, Rockne Menghini, the attending physician, personally viewed and interpreted this ECG.   Date: 04/14/2016  EKG Time: 939  Rate: 107  Rhythm: sinus tachycardia  Axis: Leftward  Intervals:none  ST&T Change: 1 mm of ST elevation in V1, positive PVC, positive peaked T wave in V3. +1 mm of ST elevation in aVR. +1 mm of ST depression in aVF.  ____________________________________________  RADIOLOGY  Dg Chest 2 View  Result Date: 04/14/2016 CLINICAL DATA:  Cough. EXAM: CHEST  2 VIEW COMPARISON:  Radiograph of December 02, 2009. FINDINGS: The heart size and mediastinal contours are within normal limits. Both lungs are clear. No pneumothorax or pleural effusion is noted. The visualized skeletal structures are unremarkable. IMPRESSION: No active cardiopulmonary disease. Electronically Signed   By: Lupita Raider, M.D.   On: 04/14/2016 10:14   Ct Head Wo Contrast  Result Date: 04/14/2016 CLINICAL DATA:  Dizziness EXAM: CT HEAD WITHOUT CONTRAST TECHNIQUE: Contiguous axial images were obtained from  the base of the skull through the vertex without intravenous contrast. COMPARISON:  11/09/2012 FINDINGS: Brain: Mild atrophic changes are noted. Mild chronic white matter ischemic change is seen. Findings consistent with prior lacunar infarct are noted in the head of the caudate nucleus on the right as well as on the left and in the deep centrum semi ovale adjacent to the head of the caudate nucleus on the left. These changes are new from the prior exam. No acute infarct, acute hemorrhage or space-occupying mass lesion is noted. Vascular: No hyperdense vessel or unexpected calcification. Skull: Normal. Negative for fracture or focal lesion. Sinuses/Orbits: No acute finding. Other: None. IMPRESSION: Chronic atrophic and ischemic changes without acute abnormality. Electronically Signed   By: Alcide Clever M.D.   On: 04/14/2016 10:23     ____________________________________________   PROCEDURES  Procedure(s) performed: None  Procedures  Critical Care performed: Yes ____________________________________________   INITIAL IMPRESSION / ASSESSMENT AND PLAN / ED COURSE  Pertinent labs & imaging results that were available during my care of the patient were reviewed by me and considered in my medical decision making (see chart for details).  56 y.o. male with a history of CVA, not on medications, cocaine and tobacco abuser, presenting with left-sided weakness resulting in falls, lightheadedness, chest pain. Overall, the patient is a vague historian with multiple different medical complaints today. However, he has significant risk factors for vascular disease. His EKG does have some possible ischemic changes, and I'm concerned about this given his chest pain history over the past week. However, at this time he does not have any chest pain and he has not had any chest pain today. He is not short of breath and he is hemodynamically stable with the exception of a tachycardia. I've spoken with Dr. Gwen Pounds, the cardiologist on-call, and given that he has multiple other issues that are ongoing and no active chest pain, we will get a troponin and continue a cardiac workup as part of his overall general workup. The patient will also receive a head CT for evaluation of stroke and if this is negative he will undergo MRI. We will get orthostatics to evaluate for hypovolemia. We will get basic labs for electrolyte disturbance or anemia, and a UA for UTI. Given his cough will get a chest x-ray for pneumonia and also has an evaluation of his heart size. This patient will likely require admission for further evaluation and treatment.  ----------------------------------------- 11:18 AM on 04/14/2016 -----------------------------------------  There are multiple clinical indicators of dehydration with hypovolemia on this patient. His  orthostatics show a jump in his heart rate with standing, he has renal insufficiency today, and his CBC appears hemoconcentrated. Clinically he has dry mucous membranes and is homeless so set at high risk population. We'll give him intravenous fluids for this.  From a neurologic standpoint, the patient has no acute findings on his CT and an MRI has been ordered.  The patient does not have any hemodynamic instability, and does not have any chest pain at this time. He does have an elevated troponin and has been treated with an aspirin, but this will need to be trended to determine whether he truly has ACS or MI. He has no evidence of STEMI.  The patient is stable for admission at this time.  CRITICAL CARE Performed by: Rockne Menghini   Total critical care time: 35 minutes  Critical care time was exclusive of separately billable procedures and treating other patients.  Critical care was necessary to  treat or prevent imminent or life-threatening deterioration.  Critical care was time spent personally by me on the following activities: development of treatment plan with patient and/or surrogate as well as nursing, discussions with consultants, evaluation of patient's response to treatment, examination of patient, obtaining history from patient or surrogate, ordering and performing treatments and interventions, ordering and review of laboratory studies, ordering and review of radiographic studies, pulse oximetry and re-evaluation of patient's condition.   ____________________________________________  FINAL CLINICAL IMPRESSION(S) / ED DIAGNOSES  Final diagnoses:  Left sided numbness  Renal insufficiency  Dehydration  Right leg weakness  Chest pain, unspecified type  Elevated troponin  Sinus tachycardia         NEW MEDICATIONS STARTED DURING THIS VISIT:  New Prescriptions   No medications on file      Rockne Menghini, MD 04/14/16 1119    Rockne Menghini,  MD 04/14/16 1120

## 2016-04-14 NOTE — ED Notes (Addendum)
Pt able to chew aspirin without difficulty. Pt given sip of water afterwards and started coughing after sip of water. Pt able to clear secretions, MD paged.

## 2016-04-14 NOTE — Care Management (Addendum)
Placed in observation for dizziness and chest pain. SLP consult pending- patient choked on a chewable aspirin and water. Patient is NPO.  Unable to carry on conversation due to contestant clearing of throat. [place] and it is documented patient had "check out " of the hotel this morning

## 2016-04-14 NOTE — H&P (Signed)
[facility] - [place] [facility]   PATIENT NAME: Steven [name]    MR#:  [id number]  DATE OF BIRTH:  Jul 23, 1960  DATE OF ADMISSION:  04/14/2016  PRIMARY CARE PHYSICIAN: No PCP Per Patient   REQUESTING/REFERRING PHYSICIAN: Dr. Sharma CovertNorman  CHIEF COMPLAINT:   Chief Complaint  Patient presents with  . Dizziness    HISTORY OF PRESENT ILLNESS:  Steven [name]  is a 56 y.o. male with a known history of Hypertension, tobacco use, likely COPD presents to the emergency room complaining of 3 days of some nausea and vomiting which has resolved but started to feel dizzy. He has also had on and off chest pains in the midsternal area which are nonexertional and resolve on their own. No palpitations. He has not had anything to eat or drink in 3 days due to the nausea and vomiting. Feels better with that now.  Has clear sputum. Mild wheezing. No orthopnea or lower extremity swelling.  Used cocaine 3 weeks back. Continues to smoke. Lives in a motel on and off. Does me he just lives outside in between [employer] stays.  PAST MEDICAL HISTORY:   Past Medical History:  Diagnosis Date  . CVA (cerebral vascular accident) (HCC)   . Hypertension     PAST SURGICAL HISTORY:  History reviewed. No pertinent surgical history.  SOCIAL HISTORY:   Social History  Substance Use Topics  . Smoking status: Current Every Day Smoker    Packs/day: 1.00    Types: Cigarettes  . Smokeless tobacco: Never Used  . Alcohol use No    FAMILY HISTORY:   Family History  Problem Relation Age of Onset  . Hypertension Father     DRUG ALLERGIES:   Allergies  Allergen Reactions  . Statins     Other reaction(s): Other (See Comments) Weakness    REVIEW OF SYSTEMS:   Review of Systems  Constitutional: Positive for malaise/fatigue. Negative for chills, fever and weight loss.  HENT: Negative for hearing loss and nosebleeds.   Eyes: Negative for blurred vision, double vision and pain.  Respiratory:  Positive for cough and wheezing. Negative for hemoptysis, sputum production and shortness of breath.   Cardiovascular: Positive for chest pain. Negative for palpitations, orthopnea and leg swelling.  Gastrointestinal: Negative for abdominal pain, constipation, diarrhea, nausea and vomiting.  Genitourinary: Negative for dysuria and hematuria.  Musculoskeletal: Negative for back pain, falls and myalgias.  Skin: Negative for rash.  Neurological: Positive for dizziness, focal weakness (chronic) and weakness. Negative for tremors, sensory change, speech change, seizures and headaches.  Endo/Heme/Allergies: Does not bruise/bleed easily.  Psychiatric/Behavioral: Negative for depression and memory loss. The patient is not nervous/anxious.     MEDICATIONS AT HOME:   Prior to Admission medications   Medication Sig Start Date End Date Taking? Authorizing Provider  lisinopril (PRINIVIL,ZESTRIL) 20 MG tablet Take 20 mg by mouth daily.    Historical Provider, MD     VITAL SIGNS:  Blood pressure 103/84, pulse (!) 108, temperature 97.9 F (36.6 C), temperature source Oral, resp. rate 18, height 5\' 4"  (1.626 m), weight 74.8 kg (165 lb), SpO2 94 %.  PHYSICAL EXAMINATION:  Physical Exam  GENERAL:  56 y.o.-year-old patient lying in the bed with no acute distress.  EYES: Pupils equal, round, reactive to light and accommodation. No scleral icterus. Extraocular muscles intact.  HEENT: Head atraumatic, normocephalic. Oropharynx and nasopharynx clear. No oropharyngeal erythema, moist oral mucosa  NECK:  Supple, no jugular venous distention. No thyroid enlargement, no tenderness.  LUNGS: Normal work of breathing. Good air entry. Bilateral mild expiratory wheezing. CARDIOVASCULAR: S1, S2 normal. No murmurs, rubs, or gallops.  ABDOMEN: Soft, nontender, nondistended. Bowel sounds present. No organomegaly or mass.  EXTREMITIES: No pedal edema, cyanosis, or clubbing. + 2 pedal & radial pulses b/l.   NEUROLOGIC:  Left upper and lower extremity weakness. Right lower extremity weakness. Right upper extremity normal. Some slurred speech. PSYCHIATRIC: The patient is alert and oriented x 3. Good affect.  SKIN: No obvious rash, lesion, or ulcer.   LABORATORY PANEL:   CBC  Recent Labs Lab 04/14/16 1024  WBC 13.0*  HGB 19.1*  HCT 54.5*  PLT 227   ------------------------------------------------------------------------------------------------------------------  Chemistries   Recent Labs Lab 04/14/16 1024  NA 139  K 3.7  CL 103  CO2 25  GLUCOSE 156*  BUN 24*  CREATININE 1.36*  CALCIUM 9.8  AST 20  ALT 14*  ALKPHOS 96  BILITOT 1.0   ------------------------------------------------------------------------------------------------------------------  Cardiac Enzymes  Recent Labs Lab 04/14/16 1024  TROPONINI 0.03*   ------------------------------------------------------------------------------------------------------------------  RADIOLOGY:  Dg Chest 2 View  Result Date: 04/14/2016 CLINICAL DATA:  Cough. EXAM: CHEST  2 VIEW COMPARISON:  Radiograph of December 02, 2009. FINDINGS: The heart size and mediastinal contours are within normal limits. Both lungs are clear. No pneumothorax or pleural effusion is noted. The visualized skeletal structures are unremarkable. IMPRESSION: No active cardiopulmonary disease. Electronically Signed   By: Lupita Raider, M.D.   On: 04/14/2016 10:14   Ct Head Wo Contrast  Result Date: 04/14/2016 CLINICAL DATA:  Dizziness EXAM: CT HEAD WITHOUT CONTRAST TECHNIQUE: Contiguous axial images were obtained from the base of the skull through the vertex without intravenous contrast. COMPARISON:  11/09/2012 FINDINGS: Brain: Mild atrophic changes are noted. Mild chronic white matter ischemic change is seen. Findings consistent with prior lacunar infarct are noted in the head of the caudate nucleus on the right as well as on the left and in the deep centrum semi ovale  adjacent to the head of the caudate nucleus on the left. These changes are new from the prior exam. No acute infarct, acute hemorrhage or space-occupying mass lesion is noted. Vascular: No hyperdense vessel or unexpected calcification. Skull: Normal. Negative for fracture or focal lesion. Sinuses/Orbits: No acute finding. Other: None. IMPRESSION: Chronic atrophic and ischemic changes without acute abnormality. Electronically Signed   By: Alcide Clever M.D.   On: 04/14/2016 10:23     IMPRESSION AND PLAN:   * Chest pain, atypical  Telemetry admission. Repeat troponin. Check stress test in the morning at risk factors. Start aspirin.  * Mild COPD exacerbation Start prednisone. Nebulizers when necessary.  * Dizziness due to dehydration. IV fluid bolus now. Repeat creatinine in the morning. Mild worsening. No acute kidney injury.  * Hypertension Patient was on lisinopril in the past. We'll start him at 10 mg daily.  * History of CVA with progressively worsening over many years My ordered from emergency room. We'll follow-up on the results. If there is anything acute he will need echocardiogram and carotid Dopplers. Start aspirin, statin.  * Tobacco abuse. Patient counseled for greater than 3 minutes to quit smoking.  All the records are reviewed and case discussed with ED provider. Management plans discussed with the patient, family and they are in agreement.  CODE STATUS: FULL  TOTAL TIME TAKING CARE OF THIS PATIENT: 40 minutes.   Milagros Loll R M.D on 04/14/2016 at 11:47 AM  Between 7am to 6pm - Pager - [phone]  After 6pm go to [email or web address] - password [email or web address] [facility]  [facility]  Office  [phone]  CC: Primary care physician; No PCP Per Patient  Note: This dictation was prepared with Dragon dictation along with smaller phrase technology. Any transcriptional errors that result from this process are unintentional.

## 2016-04-14 NOTE — ED Notes (Signed)
Pt on phone with MRI

## 2016-04-14 NOTE — ED Notes (Signed)
Hospitalist paged again.

## 2016-04-14 NOTE — ED Triage Notes (Signed)
Patient [facility] by [facility]. Patient states he felt dizzy and wanted to get checked out. Patient has been staying at the hotel and checked out this am. Patient states the dizziness started about 3 days ago. Denies pain at this time.

## 2016-04-14 NOTE — Progress Notes (Signed)
Patient admitted from ED with Neurological symptoms.  Workup thus far has been negative.   He has a very strong productive cough, with intermittent vomiting, and intermittent hiccups.  He is currently NPO due to difficulty swallowing in the ED.  Speech will evaluate before we given him anything to eat or drink.

## 2016-04-15 ENCOUNTER — Observation Stay (HOSPITAL_BASED_OUTPATIENT_CLINIC_OR_DEPARTMENT_OTHER)
Admit: 2016-04-15 | Discharge: 2016-04-15 | Disposition: A | Discharge status: Home / Self Care | Payer: Medicaid Other | Attending: Internal Medicine | Admitting: Internal Medicine

## 2016-04-15 ENCOUNTER — Observation Stay: Payer: Medicaid Other

## 2016-04-15 DIAGNOSIS — Z7189 Other specified counseling: Secondary | ICD-10-CM | POA: Diagnosis not present

## 2016-04-15 DIAGNOSIS — R259 Unspecified abnormal involuntary movements: Secondary | ICD-10-CM | POA: Diagnosis not present

## 2016-04-15 DIAGNOSIS — I639 Cerebral infarction, unspecified: Secondary | ICD-10-CM

## 2016-04-15 DIAGNOSIS — R079 Chest pain, unspecified: Secondary | ICD-10-CM

## 2016-04-15 DIAGNOSIS — Z515 Encounter for palliative care: Secondary | ICD-10-CM | POA: Diagnosis not present

## 2016-04-15 DIAGNOSIS — Z9189 Other specified personal risk factors, not elsewhere classified: Secondary | ICD-10-CM

## 2016-04-15 DIAGNOSIS — F19929 Other psychoactive substance use, unspecified with intoxication, unspecified: Secondary | ICD-10-CM

## 2016-04-15 DIAGNOSIS — G458 Other transient cerebral ischemic attacks and related syndromes: Secondary | ICD-10-CM | POA: Diagnosis not present

## 2016-04-15 DIAGNOSIS — R748 Abnormal levels of other serum enzymes: Secondary | ICD-10-CM | POA: Diagnosis not present

## 2016-04-15 DIAGNOSIS — I6789 Other cerebrovascular disease: Secondary | ICD-10-CM | POA: Diagnosis not present

## 2016-04-15 DIAGNOSIS — G459 Transient cerebral ischemic attack, unspecified: Secondary | ICD-10-CM | POA: Diagnosis not present

## 2016-04-15 DIAGNOSIS — I10 Essential (primary) hypertension: Secondary | ICD-10-CM | POA: Diagnosis not present

## 2016-04-15 LAB — CBC
HEMATOCRIT: 44.5 % (ref 40.0–52.0)
HEMOGLOBIN: 15.4 g/dL (ref 13.0–18.0)
MCH: 30.5 pg (ref 26.0–34.0)
MCHC: 34.7 g/dL (ref 32.0–36.0)
MCV: 88 fL (ref 80.0–100.0)
Platelets: 202 10*3/uL (ref 150–440)
RBC: 5.06 MIL/uL (ref 4.40–5.90)
RDW: 13.2 % (ref 11.5–14.5)
WBC: 12.9 10*3/uL — AB (ref 3.8–10.6)

## 2016-04-15 LAB — URINALYSIS, COMPLETE (UACMP) WITH MICROSCOPIC
BILIRUBIN URINE: NEGATIVE
Bacteria, UA: NONE SEEN
GLUCOSE, UA: NEGATIVE mg/dL
KETONES UR: 5 mg/dL — AB
LEUKOCYTES UA: NEGATIVE
NITRITE: NEGATIVE
PH: 5 (ref 5.0–8.0)
Protein, ur: NEGATIVE mg/dL
SPECIFIC GRAVITY, URINE: 1.027 (ref 1.005–1.030)
Squamous Epithelial / LPF: NONE SEEN

## 2016-04-15 LAB — HEMOGLOBIN A1C
HEMOGLOBIN A1C: 5.5 % (ref 4.8–5.6)
MEAN PLASMA GLUCOSE: 111 mg/dL

## 2016-04-15 LAB — BASIC METABOLIC PANEL
ANION GAP: 8 (ref 5–15)
BUN: 18 mg/dL (ref 6–20)
CALCIUM: 8.7 mg/dL — AB (ref 8.9–10.3)
CO2: 22 mmol/L (ref 22–32)
Chloride: 111 mmol/L (ref 101–111)
Creatinine, Ser: 0.95 mg/dL (ref 0.61–1.24)
Glucose, Bld: 112 mg/dL — ABNORMAL HIGH (ref 65–99)
POTASSIUM: 4.2 mmol/L (ref 3.5–5.1)
SODIUM: 141 mmol/L (ref 135–145)

## 2016-04-15 LAB — URINE DRUG SCREEN, QUALITATIVE (ARMC ONLY)
Amphetamines, Ur Screen: NOT DETECTED
BARBITURATES, UR SCREEN: NOT DETECTED
BENZODIAZEPINE, UR SCRN: NOT DETECTED
CANNABINOID 50 NG, UR ~~LOC~~: NOT DETECTED
COCAINE METABOLITE, UR ~~LOC~~: NOT DETECTED
MDMA (Ecstasy)Ur Screen: NOT DETECTED
Methadone Scn, Ur: NOT DETECTED
OPIATE, UR SCREEN: NOT DETECTED
PHENCYCLIDINE (PCP) UR S: NOT DETECTED
Tricyclic, Ur Screen: NOT DETECTED

## 2016-04-15 LAB — LIPID PANEL
CHOLESTEROL: 125 mg/dL (ref 0–200)
HDL: 27 mg/dL — AB (ref >40)
LDL Cholesterol: 75 mg/dL (ref 0–99)
TRIGLYCERIDES: 117 mg/dL (ref <150)
Total CHOL/HDL Ratio: 4.6 RATIO
VLDL: 23 mg/dL (ref 0–40)

## 2016-04-15 MED ORDER — BACLOFEN 10 MG PO TABS
5.0000 mg | ORAL_TABLET | Freq: Two times a day (BID) | ORAL | Status: DC
  Administered 2016-04-15 – 2016-04-16 (×3): 5 mg via ORAL
  Filled 2016-04-15 (×2): qty 0.5
  Filled 2016-04-15 (×2): qty 1

## 2016-04-15 NOTE — Progress Notes (Signed)
*  PRELIMINARY RESULTS* Echocardiogram 2D Echocardiogram has been performed.  Cristela BlueHege, Euline Kimbler 04/15/2016, 7:54 AM

## 2016-04-15 NOTE — Plan of Care (Signed)
Problem: Nutrition: Goal: Risk of aspiration will decrease Outcome: Progressing Will keep patient NPO

## 2016-04-15 NOTE — Care Management (Signed)
Patient is at risk for aspiration and verbalizes understanding.  palliative car consult is pending.  Placed csw referral to address homeless issues.

## 2016-04-15 NOTE — Evaluation (Signed)
Objective Swallowing Evaluation: Type of Study: Bedside Swallow Evaluation  Patient Details  Name: Steven [name] MRN: [id number] Date of Birth: 1960-11-03  Today's Date: 04/15/2016 Time: SLP Start Time (ACUTE ONLY): 0945-SLP Stop Time (ACUTE ONLY): 1045 SLP Time Calculation (min) (ACUTE ONLY): 60 min  Past Medical History:  Past Medical History:  Diagnosis Date  . CVA (cerebral vascular accident) (HCC)   . Hypertension    Past Surgical History: History reviewed. No pertinent surgical history. HPI: Pt is a 56 y.o. male , homeless, with a history of ongoing tobacco and cocaine abuse, history of multiple CVA with residual left-sided weakness, not taking any of his chronic medications, who called EMS for lightheadedness. The patient reports that 3 days ago he moved out of the hotel he was living in, not morning he woke up with lightheadedness associated with several episodes of vomiting which have resolved. He also has developed a cough without any fever, congestion or rhinorrhea, sore throat or ear pain. Over the past week he has had 8-10 episodes of a substernal "sharp" chest pain that mostly occur when he is sitting and resolve spontaneously. He denies any palpitations or syncope, no diaphoresis. He reports "I'm short of breath but I am not short of breath, you know what I mean?" The patient also reports that he has had acute on chronic worsening of his left face, upper extremity and lower extremity numbness and that this has caused him to fall several times. He denies any headache, visual changes, speech changes or mental status changes. No trauma. Pt indicated he does cough "sometimes" when eating/drinking but it's "worse in the past few days". Pt does have a baseline of multiple CVAs; noted lingual deviation to the Right w/ min protrusion when swallowing. Pt is experiencing increased phlegm which he attempts to cough/clear throat and expectorate - this has increased over months. Pt has frequent  hiccups, min involuntary shakes in UEs, head, and halting speech w/ dysfluency and min dysarthria - pt is edentulous at baseline. Pt was alert and followed commands, answered basic questions re: self.   Subjective: pt sitting in MBSS chair; verbally conversive w/ mild dysarthria noted; hiccups. Pt stated he "really wanted to eat something".   Assessment / Plan / Recommendation  CHL IP CLINICAL IMPRESSIONS 04/15/2016  Clinical Impression Pt appears to present w/ severe oropharyngeal phase dysphagia per results of this exam today which significant increase his risk for aspiration; pt is at risk for the sequelae of aspiration to significantly impact his Pulmonary status. Pt exhibited severely delayed pharyngeal swallow initiation w/ ALL trial consistencies assessed(puree, Honey and Nectar consistency liquids) - this resulted in aspiration of Nectar consistency liquids via cup w/ delayed Cough attempt response(pt does not have a consistently productive cough, more of a "gorwl". Pt exhibited decreased pharyngeal pressure and decreased laryngeal excursion and anterior movement during the swallow; no epigloittic inversion was noted which reduced full airway closure. This presentation resulted in pharygneal residue throughout pharynx post initial swallow which reduced but did not clear w/ f/u, multiple swallow; laryngeal penetration w/ aspiration of residue was noted b/t trials from the pharyngeal residue. Pt responded w/ a delayed cough attempt to clear the penetrated/aspirated material. During the oral phase, pt exhibited decreased bolus cohesion and control w/ premature spillage of bolus trials deep into the pharynx to the pyriform sinuses. Noted base of tongue residue post swallow indicating decreased strength. Due to pt's presentation of oropharyngeal phase dysphagia and high risk for aspiration, recommend NPO status for  pt at this time w/ f/u consult w/ MD and Palliative Care re: alternative means of feeding  options. Suspect pt's presentation of dysphagia could be related to his dx of multiple CVAs and progression of decline(?). Noted Neurology is consulted.    SLP Visit Diagnosis Dysphagia, oropharyngeal phase (R13.12)  Attention and concentration deficit following --  Frontal lobe and executive function deficit following --  Impact on safety and function Severe aspiration risk;Risk for inadequate nutrition/hydration      CHL IP TREATMENT RECOMMENDATION 04/15/2016  Treatment Recommendations (No Data)     Prognosis 04/15/2016  Prognosis for Safe Diet Advancement Guarded  Barriers to Reach Goals Severity of deficits  Barriers/Prognosis Comment --    CHL IP DIET RECOMMENDATION 04/15/2016  SLP Diet Recommendations NPO  Liquid Administration via --  Medication Administration Via alternative means  Compensations --  Postural Changes --      CHL IP OTHER RECOMMENDATIONS 04/15/2016  Recommended Consults (No Data)  Oral Care Recommendations Oral care QID;Staff/trained caregiver to provide oral care;Patient independent with oral care  Other Recommendations --      CHL IP FOLLOW UP RECOMMENDATIONS 04/15/2016  Follow up Recommendations Skilled Nursing facility      [facility] IP FREQUENCY AND DURATION 04/15/2016  Speech Therapy Frequency (ACUTE ONLY) (No Data)  Treatment Duration (No Data)  TBD post discussion w/ Palliative Care; Neurology; MDs re: POC    Objective:  Radiological Procedure: A videoflouroscopic evaluation of oral-preparatory, reflex initiation, and pharyngeal phases of the swallow was performed; as well as a screening of the upper esophageal phase.  I. POSTURE: upright II. VIEW: lateral III. COMPENSATORY STRATEGIES: f/u, multiple swallow; effortful swallow; cough/throat clearing strategies IV. BOLUSES ADMINISTERED:  Thin Liquid: NT  Nectar-thick Liquid: 5 tsp trials; 1 cup trial  Honey-thick Liquid: 2 tsp trials  Puree: 3 tsp trials  Mechanical Soft: NT          CHL IP  ORAL PHASE 04/15/2016  Oral Phase Impaired  Oral - Pudding Teaspoon --  Oral - Pudding Cup --  Oral - Honey Teaspoon --  Oral - Honey Cup --  Oral - Nectar Teaspoon --  Oral - Nectar Cup --  Oral - Nectar Straw --  Oral - Thin Teaspoon --  Oral - Thin Cup --  Oral - Thin Straw --  Oral - Puree --  Oral - Mech Soft --  Oral - Regular --  Oral - Multi-Consistency --  Oral - Pill --  Oral Phase - Comment decreased bolus cohesion and control; premature spillage of bolus trials; base of tongue residue    CHL IP PHARYNGEAL PHASE 04/15/2016  Pharyngeal Phase Impaired  Pharyngeal- Pudding Teaspoon --  Pharyngeal --  Pharyngeal- Pudding Cup --  Pharyngeal --  Pharyngeal- Honey Teaspoon --  Pharyngeal --  Pharyngeal- Honey Cup --  Pharyngeal --  Pharyngeal- Nectar Teaspoon --  Pharyngeal --  Pharyngeal- Nectar Cup --  Pharyngeal --  Pharyngeal- Nectar Straw --  Pharyngeal --  Pharyngeal- Thin Teaspoon --  Pharyngeal --  Pharyngeal- Thin Cup --  Pharyngeal --  Pharyngeal- Thin Straw --  Pharyngeal --  Pharyngeal- Puree --  Pharyngeal --  Pharyngeal- Mechanical Soft --  Pharyngeal --  Pharyngeal- Regular --  Pharyngeal --  Pharyngeal- Multi-consistency --  Pharyngeal --  Pharyngeal- Pill --  Pharyngeal --  Pharyngeal Comment severely delayed pharyngeal swallow initiation w/ all trials assessed(puree, Honey and Nectar consistency liquids) - this resulted in aspiration of Nectar consistency liquids w/ delayed Cough  response; decreased pharyngeal pressure during swallow; decreased laryngeal excursion and anterior movement during the swallow; pharygneal residue throughout pharynx post initial swallow which reduced but did not clear w/ f/u, multiple swallow; laryngeal penetration and aspiration of residue b/t trials from the pharyngeal residue     CHL IP CERVICAL ESOPHAGEAL PHASE 04/15/2016  Cervical Esophageal Phase WFL  Pudding Teaspoon --  Pudding Cup --  Honey Teaspoon --   Honey Cup --  Nectar Teaspoon --  Nectar Cup --  Nectar Straw --  Thin Teaspoon --  Thin Cup --  Thin Straw --  Puree --  Mechanical Soft --  Regular --  Multi-consistency --  Pill --  Cervical Esophageal Comment --    CHL IP GO 04/15/2016  Functional Assessment Tool Used clinical judgement  Functional Limitations Swallowing  Swallow Current Status (Y8657(G8996) CM  Swallow Goal Status (Q4696(G8997) CM  Swallow Discharge Status (E9528(G8998) CM  Motor Speech Current Status (U1324(G8999) (None)  Motor Speech Goal Status (M0102(G9186) (None)  Motor Speech Goal Status (V2536(G9158) (None)  Spoken Language Comprehension Current Status (U4403(G9159) (None)  Spoken Language Comprehension Goal Status (K7425(G9160) (None)  Spoken Language Comprehension Discharge Status (Z5638(G9161) (None)  Spoken Language Expression Current Status (V5643(G9162) (None)  Spoken Language Expression Goal Status (P2951(G9163) (None)  Spoken Language Expression Discharge Status (O8416(G9164) (None)  Attention Current Status (S0630(G9165) (None)  Attention Goal Status (Z6010(G9166) (None)  Attention Discharge Status (X3235(G9167) (None)  Memory Current Status (T7322(G9168) (None)  Memory Goal Status (G2542(G9169) (None)  Memory Discharge Status (H0623(G9170) (None)  Voice Current Status (J6283(G9171) (None)  Voice Goal Status (T5176(G9172) (None)  Voice Discharge Status (H6073(G9173) (None)  Other Speech-Language Pathology Functional Limitation Current Status (X1062(G9174) (None)  Other Speech-Language Pathology Functional Limitation Goal Status (I9485(G9175) (None)  Other Speech-Language Pathology Functional Limitation Discharge Status [id number]([id number]) (None)       Jerilynn SomKatherine Watson, MS, CCC-SLP Watson,Katherine 04/15/2016, 11:42 AM

## 2016-04-15 NOTE — Progress Notes (Signed)
[facility] [place] at [facility]   PATIENT NAME: Steven [name]    MR#:  [id number]  DATE OF BIRTH:  02-06-1960  SUBJECTIVE:  CHIEF COMPLAINT:   Chief Complaint  Patient presents with  . Dizziness   The patient is a 56 year old Caucasian male was admitted for history significant for history of medical noncompliance, tobacco, cocaine abuse, essential hypertension, COPD, who presents to the hospital with complaints of nausea, vomiting, midsternal chest pain. He also admitted of dizziness, which is going on for the past 5 days, dysphagia. Coughing with any food. He was evaluated by speech therapist and recommended to be placed nothing by mouth due to aspiration was CONSISTENT cyst. Brain MRI revealed patchy acute to subacute white matter infarcts in the left MCA territory, and also a small acute to subacute infarct in the left PICA territory at the lateral medulla. No associated hemorrhage or mass effect. Suspect this combination reflects synchronous small vessel disease rather than sequelae of emboli. 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   Review of Systems  Constitutional: Negative for chills, fever and weight loss.  HENT: Negative for congestion.   Eyes: Negative for blurred vision and double vision.  Respiratory: Negative for cough, sputum production, shortness of breath and wheezing.   Cardiovascular: Negative for chest pain, palpitations, orthopnea, leg swelling and PND.  Gastrointestinal: Negative for abdominal pain, blood in stool, constipation, diarrhea, nausea and vomiting.  Genitourinary: Negative for dysuria, frequency, hematuria and urgency.  Musculoskeletal: Negative for falls.  Neurological: Positive for dizziness and speech change. Negative for tremors, focal weakness and headaches.  Endo/Heme/Allergies: Does not  bruise/bleed easily.  Psychiatric/Behavioral: Negative for depression. The patient does not have insomnia.    positive for swallowing difficulty, coughing, choking on food  VITAL SIGNS: Blood pressure 95/64, pulse 83, temperature 99.3 F (37.4 C), temperature source Oral, resp. rate 19, height 5\' 4"  (1.626 m), weight 64 kg (141 lb), SpO2 99 %.  PHYSICAL EXAMINATION:   GENERAL:  56 y.o.-year-old patient lying in the bed with no acute distress. Slurred speech EYES: Pupils equal, round, reactive to light and accommodation. No scleral icterus. Extraocular muscles intact.  HEENT: Head atraumatic, normocephalic. Oropharynx and nasopharynx clear.  NECK:  Supple, no jugular venous distention. No thyroid enlargement, no tenderness.  LUNGS: Normal breath sounds bilaterally, no wheezing, rales,rhonchi or crepitation. No use of accessory muscles of respiration.  CARDIOVASCULAR: S1, S2 normal. No murmurs, rubs, or gallops.  ABDOMEN: Soft, nontender, nondistended. Bowel sounds present. No organomegaly or mass.  EXTREMITIES: No pedal edema, cyanosis, or clubbing.  NEUROLOGIC: Cranial nerves II through XII are intact. Muscle strength 5/5 in all extremities. Sensation diminished to light touch on the left. Left upper extremity. Left lower extremity near. Gait not checked. Right Sided weakness 4 out of 5 PSYCHIATRIC: The patient is alert and oriented x 3.  SKIN: No obvious rash, lesion, or ulcer.   ORDERS/RESULTS REVIEWED:   CBC  Recent Labs Lab 04/14/16 1024 04/15/16 0420  WBC 13.0* 12.9*  HGB 19.1* 15.4  HCT 54.5* 44.5  PLT 227 202  MCV 87.8 88.0  MCH 30.8 30.5  MCHC 35.1 34.7  RDW 14.0 13.2   ------------------------------------------------------------------------------------------------------------------  Chemistries   Recent Labs Lab 04/14/16 1024 04/15/16 0420  NA 139 141  K 3.7 4.2  CL 103 111  CO2 25 22  GLUCOSE 156* 112*  BUN 24* 18  CREATININE 1.36* 0.95  CALCIUM 9.8 8.7*   AST 20  --   ALT 14*  --   ALKPHOS 96  --   BILITOT 1.0  --    ------------------------------------------------------------------------------------------------------------------ estimated creatinine clearance is 73.6 mL/min (by C-G formula based on SCr of 0.95 mg/dL). ------------------------------------------------------------------------------------------------------------------ No results for input(s): TSH, T4TOTAL, T3FREE, THYROIDAB in the last 72 hours.  Invalid input(s): FREET3  Cardiac Enzymes  Recent Labs Lab 04/14/16 1024 04/14/16 1507 04/14/16 2103  TROPONINI 0.03* 0.04* 0.04*   ------------------------------------------------------------------------------------------------------------------ Invalid input(s): POCBNP ---------------------------------------------------------------------------------------------------------------  RADIOLOGY: Dg Eye Foreign Body  Result Date: 04/14/2016 CLINICAL DATA:  Screening for MRI. EXAM: ORBITS FOR FOREIGN BODY - 2 VIEW COMPARISON:  CT 04/14/2016 . FINDINGS: No radiopaque foreign bodies noted within the orbits. Patient cleared for MRI. Paranasal sinuses clear. No acute bony abnormality . IMPRESSION: No evidence of metallic foreign body within the orbits. Electronically Signed   By: Maisie Fus  Register   On: 04/14/2016 12:27   Dg Chest 2 View  Result Date: 04/14/2016 CLINICAL DATA:  Cough. EXAM: CHEST  2 VIEW COMPARISON:  Radiograph of December 02, 2009. FINDINGS: The heart size and mediastinal contours are within normal limits. Both lungs are clear. No pneumothorax or pleural effusion is noted. The visualized skeletal structures are unremarkable. IMPRESSION: No active cardiopulmonary disease. Electronically Signed   By: Lupita Raider, M.D.   On: 04/14/2016 10:14   Ct Head Wo Contrast  Result Date: 04/14/2016 CLINICAL DATA:  Dizziness EXAM: CT HEAD WITHOUT CONTRAST TECHNIQUE: Contiguous axial images were obtained from the base of the  skull through the vertex without intravenous contrast. COMPARISON:  11/09/2012 FINDINGS: Brain: Mild atrophic changes are noted. Mild chronic white matter ischemic change is seen. Findings consistent with prior lacunar infarct are noted in the head of the caudate nucleus on the right as well as on the left and in the deep centrum semi ovale adjacent to the head of the caudate nucleus on the left. These changes are new from the prior exam. No acute infarct, acute hemorrhage or space-occupying mass lesion is noted. Vascular: No hyperdense vessel or unexpected calcification. Skull: Normal. Negative for fracture or focal lesion. Sinuses/Orbits: No acute finding. Other: None. IMPRESSION: Chronic atrophic and ischemic changes without acute abnormality. Electronically Signed   By: Alcide Clever M.D.   On: 04/14/2016 10:23   Mr Brain Wo Contrast  Result Date: 04/14/2016 CLINICAL DATA:  56 year old male with 3 days of nausea vomiting and onset of dizziness. Left side numbness. Initial encounter. EXAM: MRI HEAD WITHOUT CONTRAST TECHNIQUE: Multiplanar, multiecho pulse sequences of the brain and surrounding structures were obtained without intravenous contrast. COMPARISON:  Head CT without contrast 1005 hours today. Brain MRI 12/03/2009. FINDINGS: Brain: Patchy and small scattered areas of abnormal diffusion in the left corona radiata and surrounding subcortical white matter, including in the posterior left temporal lobe. The most confluent area is about 15 mm. ADC demonstrates a mix of restricted and isointense diffusion in these areas. There is associated T2 and FLAIR hyperintensity with no mass effect or hemorrhage. Scattered additional nonspecific cerebral white matter signal changes in both hemispheres. Superimposed multiple chronic lacunar infarcts in the bilateral deep gray matter nuclei and lower pons. There is also a small 6 mm focus of abnormal diffusion in the left lateral medulla (series 100 image 10) which  appears mildly restricted. Associated T2 and FLAIR hyperintensity without mass effect or definite hemorrhage. Superimposed several chronic small lacunar infarcts in the bilateral cerebellar hemispheres. No cerebral cortical  encephalomalacia and no definite chronic cerebral blood products. No midline shift, mass effect, evidence of mass lesion, ventriculomegaly, extra-axial collection or acute intracranial hemorrhage. Cervicomedullary junction and pituitary are within normal limits. 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 Skull and upper cervical spine: Grossly negative. Sinuses/Orbits: Leftward gaze deviation. Otherwise negative orbits soft tissues. Visualized paranasal sinuses and mastoids are stable and well pneumatized. Other: Visible internal auditory structures appear normal. Negative scalp soft tissues. IMPRESSION: 1. Patchy acute to subacute white matter infarcts in the left MCA territory, and also a small acute to subacute infarct in the left PICA territory at the lateral medulla. No associated hemorrhage or mass effect. Suspect this combination reflects synchronous small vessel disease rather than sequelae of emboli. 2. Underlying advanced for age chronic small vessel disease including multiple chronic lacunar infarcts in the bilateral basal ganglia, thalami, and brainstem. 3. Mild intracranial artery dolichoectasia. Electronically Signed   By: Odessa FlemingH  Hall M.D.   On: 04/14/2016 13:19   [place] Carotid Bilateral  Result Date: 04/14/2016 CLINICAL DATA:  CVA. Slurred speech. History of hypertension and smoking. EXAM: BILATERAL CAROTID DUPLEX ULTRASOUND TECHNIQUE: Wallace CullensGray scale imaging, color Doppler and duplex ultrasound were performed of bilateral carotid and vertebral arteries in the neck. COMPARISON:  12/03/2009 FINDINGS: Criteria: Quantification of carotid stenosis is based on velocity parameters that correlate the residual internal  carotid diameter with NASCET-based stenosis levels, using the diameter of the distal internal carotid lumen as the denominator for stenosis measurement. The following velocity measurements were obtained: RIGHT ICA:  96/18 cm/sec CCA:  123/17 cm/sec SYSTOLIC ICA/CCA RATIO:  0.8 DIASTOLIC ICA/CCA RATIO:  1.1 ECA:  218 cm/sec LEFT ICA:  110/23 cm/sec CCA:  85/16 cm/sec SYSTOLIC ICA/CCA RATIO:  1.3 DIASTOLIC ICA/CCA RATIO:  1.5 ECA:  246 cm/sec RIGHT CAROTID ARTERY: There is a minimal amount of eccentric mixed echogenic partially shadowing plaque within the right carotid bulb (image 12 and 13). There is a minimal to moderate amount of eccentric mixed echogenic plaque involving the origin and proximal aspect of the right internal carotid artery (image 20), morphologically similar to the 11/2009 examination and again not resulting in elevated peak systolic velocities within the interrogated course the right internal carotid artery to suggest a hemodynamically significant stenosis. RIGHT VERTEBRAL ARTERY:  Antegrade Flow LEFT CAROTID ARTERY: There is a minimal amount of concentric intimal wall thickening / atherosclerotic plaque throughout the left common carotid artery (representative images 31 and 35). There is a moderate amount of eccentric mixed echogenic partially shadowing plaque within the left carotid bulb (image 40), extending to involve the origin and proximal aspects of the left internal carotid artery (image 49), similar to the 11/2009 examination and again not resulting in elevated peak systolic velocities within the interrogated course the left internal carotid artery to suggest a hemodynamically significant stenosis. LEFT VERTEBRAL ARTERY: Retrograde flow previously antegrade flow was demonstrated within the left vertebral artery). Note is made of asymmetrically decreased blood pressure within the left upper extremity (right upper extremity blood pressure - 150/79; left upper extremity blood pressure -  117/79) with associated monophasic waveform within the left subclavian artery (image 65). IMPRESSION: 1. Minimal to moderate amount of bilateral atherosclerotic plaque, left greater than right, morphologically similar to the 11/2009 examination and again not resulting in a hemodynamically significant stenosis within either internal carotid artery. 2. Retrograde flow now demonstrated within the left vertebral artery with associated asymmetrically decreased blood pressure within the left upper extremity -  constellation of findings worrisome for subclavian steal syndrome. Further evaluation CTA could performed as clinically indicated. Electronically Signed   By: Simonne Come M.D.   On: 04/14/2016 19:54    EKG:  Orders placed or performed during the hospital encounter of 04/14/16  . ED EKG  . ED EKG  . EKG 12-Lead  . EKG 12-Lead  . EKG 12-Lead  . EKG 12-Lead    ASSESSMENT AND PLAN:  Active Problems:   Chest pain   Cerebrovascular accident (CVA) (HCC)   At high risk for aspiration   Palliative care by specialist   Goals of care, counseling/discussion 1 Acute stroke with swallowing difficulties, ataxia, falls, patient was evaluated by speech therapist therapist recommended nothing by mouth, however, refused, palliative care discussed with patient his care, they decided on DO NOT RESUSCITATE, patient is going to be allowed to eat dysphagia 3 diet with thickened liquids. Her cardiogram revealed septal wall motion abnormalities, ejection fraction of 50-55% , The patient was seen by cardiologist, recommended aspirin, consider TEE due to concerns of embolic phenomenon. Initiate patient on statin #2. Dysphagia, patient was recommended to be nothing by mouth, since had aspiration with all consistencies. However, decided against PEG tube placement, now he is DO NOT RESUSCITATE, on dysphagia 3 diet with thickened liquids, aspiration precautions at all times . #3 . Acute renal insufficiency, resolved #4.  Elevated troponin, aspirin, Lipitor, unable to start beta blockers due to hypotension #5 hypotension, continue IV fluids, watching for fluid overload #6. Leukocytosis, allergies unclear, could be related to stress, although patient's urinalysis revealed pyuria, get urine cultures, antibiotic therapy unless culture results are positive #7 suspected subclavian steal, vascular surgery consultation is requested   Management plans discussed with the patient, family and they are in agreement.   DRUG ALLERGIES:  Allergies  Allergen Reactions  . Statins     Other reaction(s): Other (See Comments) Weakness    CODE STATUS:     Code Status Orders        Start     Ordered   04/15/16 1457  Do not attempt resuscitation (DNR)  Continuous    Question Answer Comment  In the event of cardiac or respiratory ARREST Do not call a "code blue"   In the event of cardiac or respiratory ARREST Do not perform Intubation, CPR, defibrillation or ACLS   In the event of cardiac or respiratory ARREST Use medication by any route, position, wound care, and other measures to relive pain and suffering. May use oxygen, suction and manual treatment of airway obstruction as needed for comfort.      04/15/16 1456    Code Status History    Date Active Date Inactive Code Status Order ID Comments User Context   04/14/2016 11:29 AM 04/15/2016  2:56 PM Full Code [id number]  Milagros Loll, MD ED      TOTAL Critical care TIME TAKING CARE OF THIS PATIENT: 45 minutes.   Discussed with palliative care, speech therapy, Management   Aayana Reinertsen M.D on 04/15/2016 at 5:06 PM  Between 7am to 6pm - Pager - [phone]  After 6pm go to [email or web address] - password [email or web address] [facility]  [facility]  Office  [phone]  CC: Primary care physician; No PCP Per Patient

## 2016-04-15 NOTE — Consult Note (Signed)
[facility] Consultation Note  Patient ID: Steven [name], MRN: [id number], DOB/AGE: 1960/03/24 56 y.o. Admit date: 04/14/2016   Date of Consult: 04/15/2016 Primary Physician: No PCP Per Patient Primary Cardiologist: None  Chief Complaint:  Chief Complaint  Patient presents with  . Dizziness   Reason for Consult: stroke with elevated troponin  HPI: 56 y.o. male with known previous cerebrovascular accident and essential hypertension makes hyperlipidemia and apparent previous cardiovascular disease who has not been compliant with appropriate medication management for prevention. The patient was the awakening with a significant episodes of the left sided weakness for which the patient had some other spasmic type movements in his arms and legs. He has had some remote chest pain and shortness of breath recently but nothing as significant consistent with myocardial infarction. He does have the EKG showing normal sinus rhythm left intraventricular conduct vision the defect.  Degenerative term and most consistent with his history of previous myocardial infarction. He has been placed on appropriate antihypertensive medication management management and aspirin at this time. There is been no other evidence of rhythm disturbances by telemetry. The patient did have an MRI suggesting embolic type phenomenon for his stroke. Additionally the patient has had a carotid Doppler showing no evidence of significant stenosis or currently the patient feels somewhat better and is not having any major complaints today  Past Medical History:  Diagnosis Date  . CVA (cerebral vascular accident) (HCC)   . Hypertension       Surgical History: History reviewed. No pertinent surgical history.   Home Meds: Prior to Admission medications   Medication Sig Start Date End Date Taking? Authorizing Provider  lisinopril (PRINIVIL,ZESTRIL) 20 MG tablet Take 20 mg by mouth daily.    Historical Provider, MD     Inpatient Medications:  . aspirin EC  81 mg Oral Daily  . atorvastatin  40 mg Oral q1800  . baclofen  5 mg Oral BID  . chlorhexidine  15 mL Mouth Rinse BID  . enoxaparin (LOVENOX) injection  40 mg Subcutaneous Q24H  . ipratropium-albuterol  3 mL Nebulization Once  . lisinopril  10 mg Oral Daily  . mouth rinse  15 mL Mouth Rinse q12n4p  . predniSONE  50 mg Oral Q breakfast  . sodium chloride flush  3 mL Intravenous Q12H   . 0.9 % NaCl with KCl 20 mEq / L 100 mL/hr at 04/15/16 0454    Allergies:  Allergies  Allergen Reactions  . Statins     Other reaction(s): Other (See Comments) Weakness    Social History   Social History  . Marital status: Single    Spouse name: N/A  . Number of children: N/A  . Years of education: N/A   Occupational History  . Not on file.   Social History Main Topics  . Smoking status: Current Every Day Smoker    Packs/day: 1.00    Types: Cigarettes  . Smokeless tobacco: Never Used  . Alcohol use No  . Drug use: Unknown  . Sexual activity: Not on file   Other Topics Concern  . Not on file   Social History Narrative  . No narrative on file     Family History  Problem Relation Age of Onset  . Hypertension Father      Review of Systems Positive for Left-sided weakness shortness of breath Negative for: General:  chills, fever, night sweats or weight changes.  Cardiovascular: PND orthopnea syncope dizziness  Dermatological skin lesions rashes Respiratory:  Cough congestion Urologic: Frequent urination urination at night and hematuria Abdominal: negative for nausea, vomiting, diarrhea, bright red blood per rectum, melena, or hematemesis Neurologic: negative for visual changes, and/or hearing changes  All other systems reviewed and are otherwise negative except as noted above.  Labs:  Recent Labs  04/14/16 1024 04/14/16 1151 04/14/16 1507 04/14/16 2103  CKTOTAL  --  140  --   --   TROPONINI 0.03*  --  0.04* 0.04*   Lab  Results  Component Value Date   WBC 12.9 (H) 04/15/2016   HGB 15.4 04/15/2016   HCT 44.5 04/15/2016   MCV 88.0 04/15/2016   PLT 202 04/15/2016    Recent Labs Lab 04/14/16 1024 04/15/16 0420  NA 139 141  K 3.7 4.2  CL 103 111  CO2 25 22  BUN 24* 18  CREATININE 1.36* 0.95  CALCIUM 9.8 8.7*  PROT 8.1  --   BILITOT 1.0  --   ALKPHOS 96  --   ALT 14*  --   AST 20  --   GLUCOSE 156* 112*   Lab Results  Component Value Date   CHOL 125 04/15/2016   HDL 27 (L) 04/15/2016   LDLCALC 75 04/15/2016   TRIG 117 04/15/2016   No results found for: DDIMER  Radiology/Studies:  Dg Eye Foreign Body  Result Date: 04/14/2016 CLINICAL DATA:  Screening for MRI. EXAM: ORBITS FOR FOREIGN BODY - 2 VIEW COMPARISON:  CT 04/14/2016 . FINDINGS: No radiopaque foreign bodies noted within the orbits. Patient cleared for MRI. Paranasal sinuses clear. No acute bony abnormality . IMPRESSION: No evidence of metallic foreign body within the orbits. Electronically Signed   By: Maisie Fus  Register   On: 04/14/2016 12:27   Dg Chest 2 View  Result Date: 04/14/2016 CLINICAL DATA:  Cough. EXAM: CHEST  2 VIEW COMPARISON:  Radiograph of December 02, 2009. FINDINGS: The heart size and mediastinal contours are within normal limits. Both lungs are clear. No pneumothorax or pleural effusion is noted. The visualized skeletal structures are unremarkable. IMPRESSION: No active cardiopulmonary disease. Electronically Signed   By: Lupita Raider, M.D.   On: 04/14/2016 10:14   Ct Head Wo Contrast  Result Date: 04/14/2016 CLINICAL DATA:  Dizziness EXAM: CT HEAD WITHOUT CONTRAST TECHNIQUE: Contiguous axial images were obtained from the base of the skull through the vertex without intravenous contrast. COMPARISON:  11/09/2012 FINDINGS: Brain: Mild atrophic changes are noted. Mild chronic white matter ischemic change is seen. Findings consistent with prior lacunar infarct are noted in the head of the caudate nucleus on the right as  well as on the left and in the deep centrum semi ovale adjacent to the head of the caudate nucleus on the left. These changes are new from the prior exam. No acute infarct, acute hemorrhage or space-occupying mass lesion is noted. Vascular: No hyperdense vessel or unexpected calcification. Skull: Normal. Negative for fracture or focal lesion. Sinuses/Orbits: No acute finding. Other: None. IMPRESSION: Chronic atrophic and ischemic changes without acute abnormality. Electronically Signed   By: Alcide Clever M.D.   On: 04/14/2016 10:23   Mr Brain Wo Contrast  Result Date: 04/14/2016 CLINICAL DATA:  56 year old male with 3 days of nausea vomiting and onset of dizziness. Left side numbness. Initial encounter. EXAM: MRI HEAD WITHOUT CONTRAST TECHNIQUE: Multiplanar, multiecho pulse sequences of the brain and surrounding structures were obtained without intravenous contrast. COMPARISON:  Head CT without contrast 1005 hours today. Brain MRI 12/03/2009. FINDINGS: Brain: Patchy and small scattered  areas of abnormal diffusion in the left corona radiata and surrounding subcortical white matter, including in the posterior left temporal lobe. The most confluent area is about 15 mm. ADC demonstrates a mix of restricted and isointense diffusion in these areas. There is associated T2 and FLAIR hyperintensity with no mass effect or hemorrhage. Scattered additional nonspecific cerebral white matter signal changes in both hemispheres. Superimposed multiple chronic lacunar infarcts in the bilateral deep gray matter nuclei and lower pons. There is also a small 6 mm focus of abnormal diffusion in the left lateral medulla (series 100 image 10) which appears mildly restricted. Associated T2 and FLAIR hyperintensity without mass effect or definite hemorrhage. Superimposed several chronic small lacunar infarcts in the bilateral cerebellar hemispheres. No cerebral cortical encephalomalacia and no definite chronic cerebral blood products. No  midline shift, mass effect, evidence of mass lesion, ventriculomegaly, extra-axial collection or acute intracranial hemorrhage. Cervicomedullary junction and pituitary are within normal limits. 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 Skull and upper cervical spine: Grossly negative. Sinuses/Orbits: Leftward gaze deviation. Otherwise negative orbits soft tissues. Visualized paranasal sinuses and mastoids are stable and well pneumatized. Other: Visible internal auditory structures appear normal. Negative scalp soft tissues. IMPRESSION: 1. Patchy acute to subacute white matter infarcts in the left MCA territory, and also a small acute to subacute infarct in the left PICA territory at the lateral medulla. No associated hemorrhage or mass effect. Suspect this combination reflects synchronous small vessel disease rather than sequelae of emboli. 2. Underlying advanced for age chronic small vessel disease including multiple chronic lacunar infarcts in the bilateral basal ganglia, thalami, and brainstem. 3. Mild intracranial artery dolichoectasia. Electronically Signed   By: Odessa Fleming M.D.   On: 04/14/2016 13:19   US Carotid Bilateral  Result Date: 04/14/2016 CLINICAL DATA:  CVA. Slurred speech. History of hypertension and smoking. EXAM: BILATERAL CAROTID DUPLEX ULTRASOUND TECHNIQUE: Wallace Cullens scale imaging, color Doppler and duplex ultrasound were performed of bilateral carotid and vertebral arteries in the neck. COMPARISON:  12/03/2009 FINDINGS: Criteria: Quantification of carotid stenosis is based on velocity parameters that correlate the residual internal carotid diameter with NASCET-based stenosis levels, using the diameter of the distal internal carotid lumen as the denominator for stenosis measurement. The following velocity measurements were obtained: RIGHT ICA:  96/18 cm/sec CCA:  123/17 cm/sec SYSTOLIC ICA/CCA RATIO:  0.8 DIASTOLIC  ICA/CCA RATIO:  1.1 ECA:  218 cm/sec LEFT ICA:  110/23 cm/sec CCA:  85/16 cm/sec SYSTOLIC ICA/CCA RATIO:  1.3 DIASTOLIC ICA/CCA RATIO:  1.5 ECA:  246 cm/sec RIGHT CAROTID ARTERY: There is a minimal amount of eccentric mixed echogenic partially shadowing plaque within the right carotid bulb (image 12 and 13). There is a minimal to moderate amount of eccentric mixed echogenic plaque involving the origin and proximal aspect of the right internal carotid artery (image 20), morphologically similar to the 11/2009 examination and again not resulting in elevated peak systolic velocities within the interrogated course the right internal carotid artery to suggest a hemodynamically significant stenosis. RIGHT VERTEBRAL ARTERY:  Antegrade Flow LEFT CAROTID ARTERY: There is a minimal amount of concentric intimal wall thickening / atherosclerotic plaque throughout the left common carotid artery (representative images 31 and 35). There is a moderate amount of eccentric mixed echogenic partially shadowing plaque within the left carotid bulb (image 40), extending to involve the origin and proximal aspects of the left internal carotid artery (image 49), similar to the 11/2009 examination and again not resulting  in elevated peak systolic velocities within the interrogated course the left internal carotid artery to suggest a hemodynamically significant stenosis. LEFT VERTEBRAL ARTERY: Retrograde flow previously antegrade flow was demonstrated within the left vertebral artery). Note is made of asymmetrically decreased blood pressure within the left upper extremity (right upper extremity blood pressure - 150/79; left upper extremity blood pressure - 117/79) with associated monophasic waveform within the left subclavian artery (image 65). IMPRESSION: 1. Minimal to moderate amount of bilateral atherosclerotic plaque, left greater than right, morphologically similar to the 11/2009 examination and again not resulting in a hemodynamically  significant stenosis within either internal carotid artery. 2. Retrograde flow now demonstrated within the left vertebral artery with associated asymmetrically decreased blood pressure within the left upper extremity - constellation of findings worrisome for subclavian steal syndrome. Further evaluation CTA could performed as clinically indicated. Electronically Signed   By: Simonne ComeJohn  Watts M.D.   On: 04/14/2016 19:54    EKG: Normal sinus rhythm with left intraventricular conduction defect and inferior infarct age undetermined  Weights: Filed Weights   04/14/16 [id number] 04/14/16 1340 04/15/16 0336  Weight: 74.8 kg (165 lb) 64 kg (141 lb 3.2 oz) 64 kg (141 lb)     Physical Exam: Blood pressure 95/64, pulse 83, temperature 99.3 F (37.4 C), temperature source Oral, resp. rate 19, height 5\' 4"  (1.626 m), weight 64 kg (141 lb), SpO2 99 %. Body mass index is 24.2 kg/m. General: Well developed, well nourished, in no acute distress. Head eyes ears nose throat: Normocephalic, atraumatic, sclera non-icteric, no xanthomas, nares are without discharge. No apparent thyromegaly and/or mass  Lungs: Normal respiratory effort.  Some wheezes, Few rales, no rhonchi.  Heart: RRR with normal S1 S2. no murmur gallop, no rub, PMI is normal size and placement, carotid upstroke normal without bruit, jugular venous pressure is normal Abdomen: Soft, non-tender, non-distended with normoactive bowel sounds. No hepatomegaly. No rebound/guarding. No obvious abdominal masses. Abdominal aorta is normal size without bruit Extremities: No edema. no cyanosis, no clubbing, no ulcers  Peripheral : 2+ bilateral upper extremity pulses, 2+ bilateral femoral pulses, 2+ bilateral dorsal pedal pulse Neuro: Alert and oriented. No facial asymmetry. Left-sided focal deficit. Moves all extremities spontaneously. Musculoskeletal: Normal muscle tone without kyphosis Psych:  Responds to questions appropriately with a normal affect.     Assessment: 56 year old noncompliant patient with previous history of myocardial infarction COPD tobacco abuse with elevated troponin more consistent with demand ischemia rather than acute coronary syndrome or myocardial infarction having left-sided weakness possibility of cerebrovascular accident with MRI suggesting embolic phenomenon  Plan: 1. Continue aspirin and/or other antiplatelets as necessary for further risk reduction in stroke 2. Echocardiogram for systolic dysfunction primary cause of stroke 3. Consider transesophageal echocardiogram for further evaluation of stroke. Embolic phenomenon 4. Continue telemetry following for any atrial fibrillation as a primary cause of stroke 5. High intensity cholesterol therapy for further risk reduction cardiovascular event and previous myocardial infarction X 6. Further diagnostic testing and treatment options after above  Signed, Lamar BlinksBruce J Tanish Prien M.D. [facility] 04/15/2016, 1:00 PM

## 2016-04-15 NOTE — Progress Notes (Addendum)
Some noise was heard in room 252 around 0550, this writer went in to check on patient, pt was already back in bed. Pt stated that he had a "fall while trying to charge his phone" pt denies any injury, this writer noticed that pt was bleeding from his left arm while assessing pt , pt has an abrasion to left arm, abrasion was cleansed and dressed. Pt denied any pain or injury. Neuro checks and VS are WNL, unit leadership and MD made aware. Will continue to monitor.

## 2016-04-15 NOTE — Consult Note (Signed)
[facility] Vascular Consult Note  MRN : [id number]  Steven [name] is a 56 y.o. ([birth year]) male who presents with chief complaint of  Chief Complaint  Patient presents with  . Dizziness  .  History of Present Illness: I am asked by Dr. Winona Legato to see the patient regarding left subclavian steal syndrome.  Patient is admitted with acute stroke confirmed by MRI in the left MCA as well as a small area in the left posterior circulation. He is having a very difficult time with speech, and I can really not understand much of what he is saying at this point. He has baseline right leg weakness from previous strokes as well as left-sided and right-sided altered sensation. He has a history of polysubstance abuse with multiple previous strokes. He has more facial numbness in his baseline. He was having substernal sharp chest pain. He also apparently had been smoking cocaine. As part of his workup he had a carotid duplex and MRI both of which I have independently reviewed. The MRI findings are as described above. Carotid duplex appears to show mild carotid artery stenosis bilaterally. The right vertebral artery was antegrade and the left vertebral artery was retrograde. 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 He does not have any ulcerations on his left hand. It is very difficult to discern whether or not he has pain with activity in the left arm.  Current Facility-Administered Medications  Medication Dose Route Frequency Provider Last Rate Last Dose  . 0.9 % NaCl with KCl 20 mEq/ L  infusion   Intravenous Continuous Milagros Loll, MD 100 mL/hr at 04/15/16 0454    . acetaminophen (TYLENOL) tablet 650 mg  650 mg Oral Q6H PRN Milagros Loll, MD       Or  . acetaminophen (TYLENOL) suppository 650 mg  650 mg Rectal Q6H PRN Srikar Sudini, MD      . albuterol  (PROVENTIL) (2.5 MG/3ML) 0.083% nebulizer solution 2.5 mg  2.5 mg Nebulization Q2H PRN Srikar Sudini, MD      . aspirin EC tablet 81 mg  81 mg Oral Daily Srikar Sudini, MD      . atorvastatin (LIPITOR) tablet 40 mg  40 mg Oral q1800 Srikar Sudini, MD      . baclofen (LIORESAL) tablet 5 mg  5 mg Oral BID Thana Farr, MD   5 mg at 04/15/16 1521  . chlorhexidine (PERIDEX) 0.12 % solution 15 mL  15 mL Mouth Rinse BID Milagros Loll, MD   15 mL at 04/14/16 2217  . enoxaparin (LOVENOX) injection 40 mg  40 mg Subcutaneous Q24H Milagros Loll, MD   40 mg at 04/14/16 2217  . ibuprofen (ADVIL,MOTRIN) tablet 400 mg  400 mg Oral Q6H PRN Srikar Sudini, MD      . ipratropium-albuterol (DUONEB) 0.5-2.5 (3) MG/3ML nebulizer solution 3 mL  3 mL Nebulization Once Srikar Sudini, MD      . lisinopril (PRINIVIL,ZESTRIL) tablet 10 mg  10 mg Oral Daily Milagros Loll, MD   10 mg at 04/14/16 1616  . MEDLINE mouth rinse  15 mL Mouth Rinse q12n4p Srikar Sudini, MD   15 mL at 04/15/16 1600  . ondansetron (ZOFRAN) tablet 4 mg  4 mg Oral Q6H PRN Milagros Loll, MD   4 mg at 04/14/16 1907   Or  . ondansetron (ZOFRAN) injection 4 mg  4 mg  Intravenous Q6H PRN Srikar Sudini, MD      . polyethylene glycol (MIRALAX / GLYCOLAX) packet 17 g  17 g Oral Daily PRN Srikar Sudini, MD      . predniSONE (DELTASONE) tablet 50 mg  50 mg Oral Q breakfast Milagros Loll, MD   50 mg at 04/14/16 1615  . sodium chloride flush (NS) 0.9 % injection 3 mL  3 mL Intravenous Q12H Milagros Loll, MD   3 mL at 04/15/16 1000    Past Medical History:  Diagnosis Date  . CVA (cerebral vascular accident) (HCC)   . Hypertension   Polysubstance abuse.  History reviewed. No pertinent surgical history.  Social History Social History  Substance Use Topics  . Smoking status: Current Every Day Smoker    Packs/day: 1.00    Types: Cigarettes  . Smokeless tobacco: Never Used  . Alcohol use No  Polysubstance abuse.  Family History Family History  Problem  Relation Age of Onset  . Hypertension Father   No bleeding disorders, clotting disorders, autoimmune diseases, or aneurysms  Allergies  Allergen Reactions  . Statins     Other reaction(s): Other (See Comments) Weakness     REVIEW OF SYSTEMS (Negative unless checked)  Constitutional: [] Weight loss  [] Fever  [] Chills Cardiac: [] Chest pain   [] Chest pressure   [] Palpitations   [] Shortness of breath when laying flat   [] Shortness of breath at rest   [] Shortness of breath with exertion. Vascular:  [] Pain in legs with walking   [] Pain in legs at rest   [] Pain in legs when laying flat   [] Claudication   [] Pain in feet when walking  [] Pain in feet at rest  [] Pain in feet when laying flat   [] History of DVT   [] Phlebitis   [] Swelling in legs   [] Varicose veins   [] Non-healing ulcers Pulmonary:   [] Uses home oxygen   [x] Productive cough   [] Hemoptysis   [] Wheeze  [] COPD   [] Asthma Neurologic:  [] Dizziness  [] Blackouts   [] Seizures   [x] History of stroke   [] History of TIA  [x] Aphasia   [] Temporary blindness   [x] Dysphagia   [x] Weakness or numbness in arms   [x] Weakness or numbness in legs Musculoskeletal:  [] Arthritis   [] Joint swelling   [] Joint pain   [] Low back pain Hematologic:  [] Easy bruising  [] Easy bleeding   [] Hypercoagulable state   [] Anemic  [] Hepatitis Gastrointestinal:  [] Blood in stool   [] Vomiting blood  [] Gastroesophageal reflux/heartburn   [] Difficulty swallowing. Genitourinary:  [] Chronic kidney disease   [] Difficult urination  [] Frequent urination  [] Burning with urination   [] Blood in urine Skin:  [] Rashes   [] Ulcers   [] Wounds Psychological:  [] History of anxiety   []  History of major depression.  Physical Examination  Vitals:   04/15/16 0336 04/15/16 0556 04/15/16 0626 04/15/16 1214  BP: 115/80 105/83 107/82 95/64  Pulse: 91 93 86 83  Resp: 18   19  Temp: 97.8 F (36.6 C) 98.2 F (36.8 C)  99.3 F (37.4 C)  TempSrc: Oral Oral  Oral  SpO2: 95%  99% 99%  Weight: 64 kg  (141 lb)     Height:       Body mass index is 24.2 kg/m. Gen:  WD/WN, Difficult to understand and the patient is having hiccups and coughing. Head: [place]/AT, No temporalis wasting.  Ear/Nose/Throat: Hearing grossly intact, nares w/o erythema or drainage, oropharynx w/o Erythema/Exudate Eyes: Sclera non-icteric, conjunctiva clear Neck: Trachea midline.  No JVD. No carotid bruits. Pulmonary:  Good  air movement, respirations not labored, equal bilaterally.  Cardiac: RRR, normal S1, S2. Vascular:  Vessel Right Left  Radial Palpable 1+ Palpable                                   Gastrointestinal: soft, non-tender/non-distended.  Musculoskeletal: Has 4 out of 5 strength in the right upper extremity and 4 out of 5 strength in the right lower extremity. Left upper extremity has 4 out of 5 strength in left lower extremity has 5 out of 5 strength.  Extremities without ischemic changes.  No deformity or atrophy.  Neurologic: Awake and alert. Speech is garbled and difficult to understand. Motor exam as listed above. Sensation diminished in all 4 extremities Psychiatric: Difficult to assess given his clinical status and difficulty with speech. Dermatologic: No rashes or ulcers noted.  No cellulitis or open wounds. Lymph : No Cervical, Axillary, or Inguinal lymphadenopathy.      CBC Lab Results  Component Value Date   WBC 12.9 (H) 04/15/2016   HGB 15.4 04/15/2016   HCT 44.5 04/15/2016   MCV 88.0 04/15/2016   PLT 202 04/15/2016    BMET    Component Value Date/Time   NA 141 04/15/2016 0420   NA 139 01/22/2014 0351   K 4.2 04/15/2016 0420   K 4.0 01/22/2014 0351   CL 111 04/15/2016 0420   CL 110 (H) 01/22/2014 0351   CO2 22 04/15/2016 0420   CO2 24 01/22/2014 0351   GLUCOSE 112 (H) 04/15/2016 0420   GLUCOSE 101 (H) 01/22/2014 0351   BUN 18 04/15/2016 0420   BUN 15 01/22/2014 0351   CREATININE 0.95 04/15/2016 0420   CREATININE 0.95 01/22/2014 0351   CALCIUM 8.7 (L) 04/15/2016  0420   CALCIUM 8.4 (L) 01/22/2014 0351   GFRNONAA >60 04/15/2016 0420   GFRNONAA >60 01/22/2014 0351   GFRNONAA >60 11/09/2012 1837   GFRAA >60 04/15/2016 0420   GFRAA >60 01/22/2014 0351   GFRAA >60 11/09/2012 1837   Estimated Creatinine Clearance: 73.6 mL/min (by C-G formula based on SCr of 0.95 mg/dL).  COAG Lab Results  Component Value Date   INR 1.0 01/16/2014    Radiology Dg Eye Foreign Body  Result Date: 04/14/2016 CLINICAL DATA:  Screening for MRI. EXAM: ORBITS FOR FOREIGN BODY - 2 VIEW COMPARISON:  CT 04/14/2016 . FINDINGS: No radiopaque foreign bodies noted within the orbits. Patient cleared for MRI. Paranasal sinuses clear. No acute bony abnormality . IMPRESSION: No evidence of metallic foreign body within the orbits. Electronically Signed   By: Maisie Fus  Register   On: 04/14/2016 12:27   Dg Chest 2 View  Result Date: 04/14/2016 CLINICAL DATA:  Cough. EXAM: CHEST  2 VIEW COMPARISON:  Radiograph of December 02, 2009. FINDINGS: The heart size and mediastinal contours are within normal limits. Both lungs are clear. No pneumothorax or pleural effusion is noted. The visualized skeletal structures are unremarkable. IMPRESSION: No active cardiopulmonary disease. Electronically Signed   By: Lupita Raider, M.D.   On: 04/14/2016 10:14   Ct Head Wo Contrast  Result Date: 04/14/2016 CLINICAL DATA:  Dizziness EXAM: CT HEAD WITHOUT CONTRAST TECHNIQUE: Contiguous axial images were obtained from the base of the skull through the vertex without intravenous contrast. COMPARISON:  11/09/2012 FINDINGS: Brain: Mild atrophic changes are noted. Mild chronic white matter ischemic change is seen. Findings consistent with prior lacunar infarct are noted in the head of the  caudate nucleus on the right as well as on the left and in the deep centrum semi ovale adjacent to the head of the caudate nucleus on the left. These changes are new from the prior exam. No acute infarct, acute hemorrhage or  space-occupying mass lesion is noted. Vascular: No hyperdense vessel or unexpected calcification. Skull: Normal. Negative for fracture or focal lesion. Sinuses/Orbits: No acute finding. Other: None. IMPRESSION: Chronic atrophic and ischemic changes without acute abnormality. Electronically Signed   By: Alcide CleverMark  Lukens M.D.   On: 04/14/2016 10:23   Mr Brain Wo Contrast  Result Date: 04/14/2016 CLINICAL DATA:  56 year old male with 3 days of nausea vomiting and onset of dizziness. Left side numbness. Initial encounter. EXAM: MRI HEAD WITHOUT CONTRAST TECHNIQUE: Multiplanar, multiecho pulse sequences of the brain and surrounding structures were obtained without intravenous contrast. COMPARISON:  Head CT without contrast 1005 hours today. Brain MRI 12/03/2009. FINDINGS: Brain: Patchy and small scattered areas of abnormal diffusion in the left corona radiata and surrounding subcortical white matter, including in the posterior left temporal lobe. The most confluent area is about 15 mm. ADC demonstrates a mix of restricted and isointense diffusion in these areas. There is associated T2 and FLAIR hyperintensity with no mass effect or hemorrhage. Scattered additional nonspecific cerebral white matter signal changes in both hemispheres. Superimposed multiple chronic lacunar infarcts in the bilateral deep gray matter nuclei and lower pons. There is also a small 6 mm focus of abnormal diffusion in the left lateral medulla (series 100 image 10) which appears mildly restricted. Associated T2 and FLAIR hyperintensity without mass effect or definite hemorrhage. Superimposed several chronic small lacunar infarcts in the bilateral cerebellar hemispheres. No cerebral cortical encephalomalacia and no definite chronic cerebral blood products. No midline shift, mass effect, evidence of mass lesion, ventriculomegaly, extra-axial collection or acute intracranial hemorrhage. Cervicomedullary junction and pituitary are within normal limits.  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 Skull and upper cervical spine: Grossly negative. Sinuses/Orbits: Leftward gaze deviation. Otherwise negative orbits soft tissues. Visualized paranasal sinuses and mastoids are stable and well pneumatized. Other: Visible internal auditory structures appear normal. Negative scalp soft tissues. IMPRESSION: 1. Patchy acute to subacute white matter infarcts in the left MCA territory, and also a small acute to subacute infarct in the left PICA territory at the lateral medulla. No associated hemorrhage or mass effect. Suspect this combination reflects synchronous small vessel disease rather than sequelae of emboli. 2. Underlying advanced for age chronic small vessel disease including multiple chronic lacunar infarcts in the bilateral basal ganglia, thalami, and brainstem. 3. Mild intracranial artery dolichoectasia. Electronically Signed   By: Odessa FlemingH  Hall M.D.   On: 04/14/2016 13:19   [place] Carotid Bilateral  Result Date: 04/14/2016 CLINICAL DATA:  CVA. Slurred speech. History of hypertension and smoking. EXAM: BILATERAL CAROTID DUPLEX ULTRASOUND TECHNIQUE: Wallace CullensGray scale imaging, color Doppler and duplex ultrasound were performed of bilateral carotid and vertebral arteries in the neck. COMPARISON:  12/03/2009 FINDINGS: Criteria: Quantification of carotid stenosis is based on velocity parameters that correlate the residual internal carotid diameter with NASCET-based stenosis levels, using the diameter of the distal internal carotid lumen as the denominator for stenosis measurement. The following velocity measurements were obtained: RIGHT ICA:  96/18 cm/sec CCA:  123/17 cm/sec SYSTOLIC ICA/CCA RATIO:  0.8 DIASTOLIC ICA/CCA RATIO:  1.1 ECA:  218 cm/sec LEFT ICA:  110/23 cm/sec CCA:  85/16 cm/sec SYSTOLIC ICA/CCA RATIO:  1.3 DIASTOLIC ICA/CCA RATIO:  1.5  ECA:  246 cm/sec RIGHT CAROTID ARTERY: There is a minimal  amount of eccentric mixed echogenic partially shadowing plaque within the right carotid bulb (image 12 and 13). There is a minimal to moderate amount of eccentric mixed echogenic plaque involving the origin and proximal aspect of the right internal carotid artery (image 20), morphologically similar to the 11/2009 examination and again not resulting in elevated peak systolic velocities within the interrogated course the right internal carotid artery to suggest a hemodynamically significant stenosis. RIGHT VERTEBRAL ARTERY:  Antegrade Flow LEFT CAROTID ARTERY: There is a minimal amount of concentric intimal wall thickening / atherosclerotic plaque throughout the left common carotid artery (representative images 31 and 35). There is a moderate amount of eccentric mixed echogenic partially shadowing plaque within the left carotid bulb (image 40), extending to involve the origin and proximal aspects of the left internal carotid artery (image 49), similar to the 11/2009 examination and again not resulting in elevated peak systolic velocities within the interrogated course the left internal carotid artery to suggest a hemodynamically significant stenosis. LEFT VERTEBRAL ARTERY: Retrograde flow previously antegrade flow was demonstrated within the left vertebral artery). Note is made of asymmetrically decreased blood pressure within the left upper extremity (right upper extremity blood pressure - 150/79; left upper extremity blood pressure - 117/79) with associated monophasic waveform within the left subclavian artery (image 65). IMPRESSION: 1. Minimal to moderate amount of bilateral atherosclerotic plaque, left greater than right, morphologically similar to the 11/2009 examination and again not resulting in a hemodynamically significant stenosis within either internal carotid artery. 2. Retrograde flow now demonstrated within the left vertebral artery with associated asymmetrically decreased blood pressure within the left  upper extremity - constellation of findings worrisome for subclavian steal syndrome. Further evaluation CTA could performed as clinically indicated. Electronically Signed   By: Simonne Come M.D.   On: 04/14/2016 19:54      Assessment/Plan 1. Stroke. Significant. Largely in the MCA territory although acute left medullary infarct is also present. Neurology has seen. He has been started on aspirin.  this is very unlikely from his subclavian artery lesion and does not fit the profile of a posterior circulation stroke from poor perfusion. This is much more likely from polysubstance abuse including cocaine, severe hypertension, and possibly cardiac. 2.  retrograde left vertebral artery with lower left arm blood pressure consistent with left subclavian artery stenosis or occlusion. Likely not that symptomatic although difficult to discern from the patient is point. Unlikely to be the cause of stroke. Most of these do not require treatment, but I would recommend a follow-up in our office with duplex in 1-2 months. 3.  polysubstance abuse. Severe issue and likely one of the underlying problems increasing his risk of stroke. 4. Hypertension. Worsened by polysubstance abuse and a possible cause of stroke. At this point, permissive hypertension after his stroke is likely reasonable. Long-term, better controlled be improved.  5. Tobacco dependence. Increases his risk of Vascular disease. Cessation recommended.   Festus Barren, MD  04/15/2016 5:04 PM    This note was created with Dragon medical transcription system.  Any error is purely unintentional

## 2016-04-15 NOTE — Consult Note (Signed)
Consultation Note Date: 04/15/2016   Patient Name: Steven [name]  DOB: 10/31/1960  MRN: [id number]  Age / Sex: 56 y.o., male  PCP: No Pcp Per Patient Referring Physician: Theodoro Grist, MD  Reason for Consultation: Establishing goals of care  HPI/Patient Profile: 56 y.o. male  with past medical history of hypertension and CVA admitted on 04/14/2016 with dizziness, nausea/vomiting, and chest pain. Also with worsening left sided weakness. Neurology following. 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 Carotid dopplers negative for significant stenosis. Elevated troponin likely demand ischemia. Cardiology following. ECHO pending. Telemetry to monitor for afib. Patient has been evaluated by speech therapy and is very high risk for aspiration, therefore recommending NPO. Patient with history of noncompliance and substance abuse. Patient continuously asking to eat. Palliative medicine consultation for goals of care.         Clinical Assessment and Goals of Care: I have reviewed medical records, discussed with multiple members of the care team, and met with patient at bedside to discuss diagnosis, [place], EOL wishes, disposition and options. Also spoke with daughter, Steven [name] via telephone during my conversation with the patient.   Introduced Palliative Medicine as specialized medical care for people living with serious illness. It focuses on providing relief from the symptoms and stress of a serious illness. The goal is to improve quality of life for both the patient and the family.  Steven [name] tells me [name] does not have a spouse and she is his only child. She has tried to help him find housing many times but he always "gets kicked out for the choices he makes." When the weather is bad, she ensures he is at a hotel but other than that, she cannot control the "choices he has made in  life."   Discussed current hospitalization including new stroke and high risk for aspiration which has led to NPO and patient possibly needing a feeding tube. Patient refuses a feeding tube. He states "I have not eaten in four days" and "I just want to eat." I educated patient and daughter multiple times on high risk for aspiration if we allow him to eat. Also this places him at high risk for decompensation if he should go into respiratory distress requiring intubation.   Knowing the patient and daughter understand the risks with eating, I recommended DNR/DNI. Daughter confirms that he has spoken in the past of not wanting to be resuscitated or on a breathing machine. Patient and daughter agree with DNR/DNI code status. Dysphagia diet order placed.   Updated on care plan moving forward. If eligible for rehab, daughter thinks he may be agreeable for short term but knows he would refuse to be placed in a long term care facility.   Answered questions and concerns. Daughter is appreciative of our conversation.     SUMMARY OF RECOMMENDATIONS    DNR/DNI. Durable DNR placed in chart.   Educated patient and daughter on high risk for aspiration and decompensation if he should aspirate. Refusing PEG  tube.   Discussed with SLP. Dysphagia 3 diet with nectar thick liquids ordered.   If the patient should decompensate while hospitalized, the goal will be comfort and dignity at the end of life.   PMT will continue to shadow chart and support patient/family through hospitalization.   Code Status/Advance Care Planning:  DNR   Symptom Management:   Per attending  Palliative Prophylaxis:   Aspiration, Delirium Protocol, Oral Care and Turn Reposition  Psycho-social/Spiritual:   Desire for further Chaplaincy support:no  Additional Recommendations: Caregiving  Support/Resources  Prognosis:   Unable to determine guarded with new CVA, high aspiration and decompensation risk  Discharge Planning:  To Be Determined      Primary Diagnoses: Present on Admission: . Chest pain   I have reviewed the medical record, interviewed the patient and family, and examined the patient. The following aspects are pertinent.  Past Medical History:  Diagnosis Date  . CVA (cerebral vascular accident) ([facility])   . Hypertension    Social History   Social History  . Marital status: Single    Spouse name: N/A  . Number of children: N/A  . Years of education: N/A   Social History Main Topics  . Smoking status: Current Every Day Smoker    Packs/day: 1.00    Types: Cigarettes  . Smokeless tobacco: Never Used  . Alcohol use No  . Drug use: Unknown  . Sexual activity: Not Asked   Other Topics Concern  . None   Social History Narrative  . None   Family History  Problem Relation Age of Onset  . Hypertension Father    Scheduled Meds: . aspirin EC  81 mg Oral Daily  . atorvastatin  40 mg Oral q1800  . baclofen  5 mg Oral BID  . chlorhexidine  15 mL Mouth Rinse BID  . enoxaparin (LOVENOX) injection  40 mg Subcutaneous Q24H  . ipratropium-albuterol  3 mL Nebulization Once  . lisinopril  10 mg Oral Daily  . mouth rinse  15 mL Mouth Rinse q12n4p  . predniSONE  50 mg Oral Q breakfast  . sodium chloride flush  3 mL Intravenous Q12H   Continuous Infusions: . 0.9 % NaCl with KCl 20 mEq / L 100 mL/hr at 04/15/16 0454   PRN Meds:.acetaminophen **OR** acetaminophen, albuterol, ibuprofen, ondansetron **OR** ondansetron (ZOFRAN) IV, polyethylene glycol Medications Prior to Admission:  Prior to Admission medications   Medication Sig Start Date End Date Taking? Authorizing Provider  lisinopril (PRINIVIL,ZESTRIL) 20 MG tablet Take 20 mg by mouth daily.    Historical Provider, MD   Allergies  Allergen Reactions  . Statins     Other reaction(s): Other (See Comments) Weakness   Review of Systems  Constitutional: Positive for activity change.  Cardiovascular: Positive for chest pain.    Gastrointestinal: Positive for nausea and vomiting.  Neurological: Positive for dizziness.   Physical Exam  Constitutional: He is oriented to person, place, and time. He is cooperative. He appears ill.  HENT:  Head: Normocephalic and atraumatic.  Cardiovascular: Regular rhythm.   Pulmonary/Chest: He has decreased breath sounds. He has rhonchi.  Hiccups/esophogeal spasms   Abdominal: Normal appearance.  Neurological: He is alert and oriented to person, place, and time.  Skin: Skin is warm and dry.  Psychiatric: He has a normal mood and affect. His behavior is normal. His speech is slurred.  Nursing note and vitals reviewed.  Vital Signs: BP 95/64 (BP Location: Left Arm)   Pulse 83   Temp 99.3  F (37.4 C) (Oral)   Resp 19   Ht _0  (1.626 m)   Wt 64 kg (141 lb)   SpO2 99%   BMI 24.20 kg/m  Pain Assessment: No/denies pain     SpO2: SpO2: 99 % O2 Device:SpO2: 99 % O2 Flow Rate: .   IO: Intake/output summary:   Intake/Output Summary (Last 24 hours) at 04/15/16 1524 Last data filed at 04/15/16 0545  Gross per 24 hour  Intake          1118.33 ml  Output              350 ml  Net           768.33 ml    LBM: Last BM Date: 04/11/16 Baseline Weight: Weight: 74.8 kg (165 lb) Most recent weight: Weight: 64 kg (141 lb)     Palliative Assessment/Data: PPS 40%   Flowsheet Rows     Most Recent Value  Intake Tab  Referral Department  Hospitalist  Unit at Time of Referral  Cardiac/Telemetry Unit  Palliative Care Primary Diagnosis  Neurology  Palliative Care Type  New Palliative care  Reason for referral  Clarify Goals of Care  Date of Admission  04/14/16  Date first seen by Palliative Care  04/15/16  Clinical Assessment  Palliative Performance Scale Score  40%  Psychosocial & Spiritual Assessment  Palliative Care Outcomes  Patient/Family meeting held?  Yes  Who was at the meeting?  patient and daughter via telephone  Palliative Care Outcomes  Clarified goals of care,  Provided psychosocial or spiritual support, ACP counseling assistance, Changed CPR status      Time In: 1410 Time Out: 1520 Time Total: 30mn Greater than 50%  of this time was spent counseling and coordinating care related to the above assessment and plan.  Signed by:  MIhor Dow FNP-C Palliative Medicine Team  Phone: [phone]: [phone]  Please contact Palliative Medicine Team phone at [phone] questions and concerns.  For individual provider: See AShea Evans

## 2016-04-15 NOTE — Consult Note (Addendum)
Referring Physician: Winona LegatoVaickute    Chief Complaint: Chest pain  HPI: Steven [name] is an 56 y.o. male reports that 3 days ago he moved out of the hotel he was living in.  The next morning he woke up with lightheadedness associated with several episodes of vomiting which have resolved. He also has developed a cough without any fever, congestion or rhinorrhea, sore throat or ear pain. Over the past week he has had 8-10 episodes of a substernal "sharp" chest pain that mostly occur when he is sitting and resolve spontaneously. He denies any palpitations or syncope, no diaphoresis. He reports "I'm short of breath but I am not short of breath, you know what I mean?" The patient also reports that he has had acute on chronic worsening of his left face, upper extremity and lower extremity numbness and that this has caused him to fall several times. Patient reports baseline right lower extremity and left sided altered sensation, as well as right leg weakness from past strokes.  Date last known well: Unable to determine Time last known well: Unable to determine tPA Given: No: Unable to determine LKW  Past Medical History:  Diagnosis Date  . CVA (cerebral vascular accident) (HCC)   . Hypertension     History reviewed. No pertinent surgical history.  Family History  Problem Relation Age of Onset  . Hypertension Father    Social History:  reports that he has been smoking Cigarettes.  He has been smoking about 1.00 pack per day. He has never used smokeless tobacco. He reports that he does not drink alcohol. His drug history is not on file.  Allergies:  Allergies  Allergen Reactions  . Statins     Other reaction(s): Other (See Comments) Weakness    Medications:  I have reviewed the patient's current medications. Prior to Admission:  Prescriptions Prior to Admission  Medication Sig Dispense Refill Last Dose  . lisinopril (PRINIVIL,ZESTRIL) 20 MG tablet Take 20 mg by mouth daily.   Not Taking at  Unknown time   Scheduled: . aspirin EC  81 mg Oral Daily  . atorvastatin  40 mg Oral q1800  . chlorhexidine  15 mL Mouth Rinse BID  . enoxaparin (LOVENOX) injection  40 mg Subcutaneous Q24H  . ipratropium-albuterol  3 mL Nebulization Once  . lisinopril  10 mg Oral Daily  . mouth rinse  15 mL Mouth Rinse q12n4p  . predniSONE  50 mg Oral Q breakfast  . sodium chloride flush  3 mL Intravenous Q12H    ROS: History obtained from the patient  General ROS: negative for - chills, fatigue, fever, night sweats, weight gain or weight loss Psychological ROS: negative for - behavioral disorder, hallucinations, memory difficulties, mood swings or suicidal ideation Ophthalmic ROS: negative for - blurry vision, double vision, eye pain or loss of vision ENT ROS: negative for - epistaxis, nasal discharge, oral lesions, sore throat, tinnitus or vertigo Allergy and Immunology ROS: negative for - hives or itchy/watery eyes Hematological and Lymphatic ROS: negative for - bleeding problems, bruising or swollen lymph nodes Endocrine ROS: negative for - galactorrhea, hair pattern changes, polydipsia/polyuria or temperature intolerance Respiratory ROS: as noted in HPI Cardiovascular ROS: as noted in HPI Gastrointestinal ROS: as noted in HPI Genito-Urinary ROS: negative for - dysuria, hematuria, incontinence or urinary frequency/urgency Musculoskeletal ROS: negative for - joint swelling or muscular weakness Neurological ROS: as noted in HPI Dermatological ROS: negative for rash and skin lesion changes  Physical Examination: Blood pressure 95/64, pulse  83, temperature 99.3 F (37.4 C), temperature source Oral, resp. rate 19, height 5\' 4"  (1.626 m), weight 64 kg (141 lb), SpO2 99 %.  HEENT-  Normocephalic, no lesions, without obvious abnormality.  Normal external eye and conjunctiva.  Normal TM's bilaterally.  Normal auditory canals and external ears. Normal external nose, mucus membranes and septum.  Normal  pharynx. Cardiovascular- S1, S2 normal, pulses palpable throughout   Lungs- chest clear, no wheezing, rales, normal symmetric air entry Abdomen- soft, non-tender; bowel sounds normal; no masses,  no organomegaly Extremities- no edema Lymph-no adenopathy palpable Musculoskeletal-no joint tenderness, deformity or swelling Skin-warm and dry, no hyperpigmentation, vitiligo, or suspicious lesions  Neurological Examination   Mental Status: Alert, oriented.  Speech fluent without evidence of aphasia.  Able to follow 3 step commands without difficulty. Cranial Nerves: II: Discs flat bilaterally; Visual fields grossly normal, pupils equal, round, reactive to light and accommodation III,IV, VI: ptosis not present, extra-ocular motions intact bilaterally V,VII: decrease in right NLF, facial light touch sensation normal bilaterally VIII: hearing normal bilaterally IX,X: gag reflex present XI: bilateral shoulder shrug XII: midline tongue extension Motor: Right : Upper extremity   5/5    Left:     Upper extremity   5/5  Lower extremity   4/5     Lower extremity   5/5 Tone and bulk:normal tone throughout; no atrophy noted.  Frequent myoclonus noted and spasms.  Frequent hiccups. Sensory: Pinprick and light touch impaired in the left upper extremity, left lower extremity and right lower extremity Deep Tendon Reflexes: 2+ and symmetric throughout Plantars: Right: upgoing   Left: mute Cerebellar: Normal finger-to-nose and normal heel-to-shin testing bilaterally Gait: not tested due to safety concerns   Laboratory Studies:  Basic Metabolic Panel:  Recent Labs Lab 04/14/16 1024 04/15/16 0420  NA 139 141  K 3.7 4.2  CL 103 111  CO2 25 22  GLUCOSE 156* 112*  BUN 24* 18  CREATININE 1.36* 0.95  CALCIUM 9.8 8.7*    Liver Function Tests:  Recent Labs Lab 04/14/16 1024  AST 20  ALT 14*  ALKPHOS 96  BILITOT 1.0  PROT 8.1  ALBUMIN 4.5   No results for input(s): LIPASE, AMYLASE in the  last 168 hours. No results for input(s): AMMONIA in the last 168 hours.  CBC:  Recent Labs Lab 04/14/16 1024 04/15/16 0420  WBC 13.0* 12.9*  HGB 19.1* 15.4  HCT 54.5* 44.5  MCV 87.8 88.0  PLT 227 202    Cardiac Enzymes:  Recent Labs Lab 04/14/16 1024 04/14/16 1151 04/14/16 1507 04/14/16 2103  CKTOTAL  --  140  --   --   TROPONINI 0.03*  --  0.04* 0.04*    BNP: Invalid input(s): POCBNP  CBG: No results for input(s): GLUCAP in the last 168 hours.  Microbiology: No results found for this or any previous visit.  Coagulation Studies: No results for input(s): LABPROT, INR in the last 72 hours.  Urinalysis:  Recent Labs Lab 04/15/16 0805  COLORURINE YELLOW*  LABSPEC 1.027  PHURINE 5.0  GLUCOSEU NEGATIVE  HGBUR SMALL*  BILIRUBINUR NEGATIVE  KETONESUR 5*  PROTEINUR NEGATIVE  NITRITE NEGATIVE  LEUKOCYTESUR NEGATIVE    Lipid Panel:    Component Value Date/Time   CHOL 125 04/15/2016 0420   TRIG 117 04/15/2016 0420   HDL 27 (L) 04/15/2016 0420   CHOLHDL 4.6 04/15/2016 0420   VLDL 23 04/15/2016 0420   LDLCALC 75 04/15/2016 0420    HgbA1C:  Lab Results  Component  Value Date   HGBA1C 5.5 04/14/2016    Urine Drug Screen:     Component Value Date/Time   LABOPIA NONE DETECTED 04/15/2016 0805   COCAINSCRNUR NONE DETECTED 04/15/2016 0805   LABBENZ NONE DETECTED 04/15/2016 0805   AMPHETMU NONE DETECTED 04/15/2016 0805   THCU NONE DETECTED 04/15/2016 0805   LABBARB NONE DETECTED 04/15/2016 0805    Alcohol Level:  Recent Labs Lab 04/14/16 1024  ETH <5    Other results: EKG: sinus tachycardia at 107 bpm.  Imaging: Dg Eye Foreign Body  Result Date: 04/14/2016 CLINICAL DATA:  Screening for MRI. EXAM: ORBITS FOR FOREIGN BODY - 2 VIEW COMPARISON:  CT 04/14/2016 . FINDINGS: No radiopaque foreign bodies noted within the orbits. Patient cleared for MRI. Paranasal sinuses clear. No acute bony abnormality . IMPRESSION: No evidence of metallic foreign body  within the orbits. Electronically Signed   By: Maisie Fus  Register   On: 04/14/2016 12:27   Dg Chest 2 View  Result Date: 04/14/2016 CLINICAL DATA:  Cough. EXAM: CHEST  2 VIEW COMPARISON:  Radiograph of December 02, 2009. FINDINGS: The heart size and mediastinal contours are within normal limits. Both lungs are clear. No pneumothorax or pleural effusion is noted. The visualized skeletal structures are unremarkable. IMPRESSION: No active cardiopulmonary disease. Electronically Signed   By: Lupita Raider, M.D.   On: 04/14/2016 10:14   Ct Head Wo Contrast  Result Date: 04/14/2016 CLINICAL DATA:  Dizziness EXAM: CT HEAD WITHOUT CONTRAST TECHNIQUE: Contiguous axial images were obtained from the base of the skull through the vertex without intravenous contrast. COMPARISON:  11/09/2012 FINDINGS: Brain: Mild atrophic changes are noted. Mild chronic white matter ischemic change is seen. Findings consistent with prior lacunar infarct are noted in the head of the caudate nucleus on the right as well as on the left and in the deep centrum semi ovale adjacent to the head of the caudate nucleus on the left. These changes are new from the prior exam. No acute infarct, acute hemorrhage or space-occupying mass lesion is noted. Vascular: No hyperdense vessel or unexpected calcification. Skull: Normal. Negative for fracture or focal lesion. Sinuses/Orbits: No acute finding. Other: None. IMPRESSION: Chronic atrophic and ischemic changes without acute abnormality. Electronically Signed   By: Alcide Clever M.D.   On: 04/14/2016 10:23   Mr Brain Wo Contrast  Result Date: 04/14/2016 CLINICAL DATA:  56 year old male with 3 days of nausea vomiting and onset of dizziness. Left side numbness. Initial encounter. EXAM: MRI HEAD WITHOUT CONTRAST TECHNIQUE: Multiplanar, multiecho pulse sequences of the brain and surrounding structures were obtained without intravenous contrast. COMPARISON:  Head CT without contrast 1005 hours today. Brain  MRI 12/03/2009. FINDINGS: Brain: Patchy and small scattered areas of abnormal diffusion in the left corona radiata and surrounding subcortical white matter, including in the posterior left temporal lobe. The most confluent area is about 15 mm. ADC demonstrates a mix of restricted and isointense diffusion in these areas. There is associated T2 and FLAIR hyperintensity with no mass effect or hemorrhage. Scattered additional nonspecific cerebral white matter signal changes in both hemispheres. Superimposed multiple chronic lacunar infarcts in the bilateral deep gray matter nuclei and lower pons. There is also a small 6 mm focus of abnormal diffusion in the left lateral medulla (series 100 image 10) which appears mildly restricted. Associated T2 and FLAIR hyperintensity without mass effect or definite hemorrhage. Superimposed several chronic small lacunar infarcts in the bilateral cerebellar hemispheres. No cerebral cortical encephalomalacia and no definite chronic cerebral  blood products. No midline shift, mass effect, evidence of mass lesion, ventriculomegaly, extra-axial collection or acute intracranial hemorrhage. Cervicomedullary junction and pituitary are within normal limits. 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 Skull and upper cervical spine: Grossly negative. Sinuses/Orbits: Leftward gaze deviation. Otherwise negative orbits soft tissues. Visualized paranasal sinuses and mastoids are stable and well pneumatized. Other: Visible internal auditory structures appear normal. Negative scalp soft tissues. IMPRESSION: 1. Patchy acute to subacute white matter infarcts in the left MCA territory, and also a small acute to subacute infarct in the left PICA territory at the lateral medulla. No associated hemorrhage or mass effect. Suspect this combination reflects synchronous small vessel disease rather than sequelae of emboli. 2. Underlying  advanced for age chronic small vessel disease including multiple chronic lacunar infarcts in the bilateral basal ganglia, thalami, and brainstem. 3. Mild intracranial artery dolichoectasia. Electronically Signed   By: Odessa Fleming M.D.   On: 04/14/2016 13:19   US Carotid Bilateral  Result Date: 04/14/2016 CLINICAL DATA:  CVA. Slurred speech. History of hypertension and smoking. EXAM: BILATERAL CAROTID DUPLEX ULTRASOUND TECHNIQUE: Wallace Cullens scale imaging, color Doppler and duplex ultrasound were performed of bilateral carotid and vertebral arteries in the neck. COMPARISON:  12/03/2009 FINDINGS: Criteria: Quantification of carotid stenosis is based on velocity parameters that correlate the residual internal carotid diameter with NASCET-based stenosis levels, using the diameter of the distal internal carotid lumen as the denominator for stenosis measurement. The following velocity measurements were obtained: RIGHT ICA:  96/18 cm/sec CCA:  123/17 cm/sec SYSTOLIC ICA/CCA RATIO:  0.8 DIASTOLIC ICA/CCA RATIO:  1.1 ECA:  218 cm/sec LEFT ICA:  110/23 cm/sec CCA:  85/16 cm/sec SYSTOLIC ICA/CCA RATIO:  1.3 DIASTOLIC ICA/CCA RATIO:  1.5 ECA:  246 cm/sec RIGHT CAROTID ARTERY: There is a minimal amount of eccentric mixed echogenic partially shadowing plaque within the right carotid bulb (image 12 and 13). There is a minimal to moderate amount of eccentric mixed echogenic plaque involving the origin and proximal aspect of the right internal carotid artery (image 20), morphologically similar to the 11/2009 examination and again not resulting in elevated peak systolic velocities within the interrogated course the right internal carotid artery to suggest a hemodynamically significant stenosis. RIGHT VERTEBRAL ARTERY:  Antegrade Flow LEFT CAROTID ARTERY: There is a minimal amount of concentric intimal wall thickening / atherosclerotic plaque throughout the left common carotid artery (representative images 31 and 35). There is a moderate  amount of eccentric mixed echogenic partially shadowing plaque within the left carotid bulb (image 40), extending to involve the origin and proximal aspects of the left internal carotid artery (image 49), similar to the 11/2009 examination and again not resulting in elevated peak systolic velocities within the interrogated course the left internal carotid artery to suggest a hemodynamically significant stenosis. LEFT VERTEBRAL ARTERY: Retrograde flow previously antegrade flow was demonstrated within the left vertebral artery). Note is made of asymmetrically decreased blood pressure within the left upper extremity (right upper extremity blood pressure - 150/79; left upper extremity blood pressure - 117/79) with associated monophasic waveform within the left subclavian artery (image 65). IMPRESSION: 1. Minimal to moderate amount of bilateral atherosclerotic plaque, left greater than right, morphologically similar to the 11/2009 examination and again not resulting in a hemodynamically significant stenosis within either internal carotid artery. 2. 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 Further evaluation CTA could performed as clinically indicated. Electronically Signed   By: Simonne Come M.D.   On: 04/14/2016 19:54    Assessment: 56 y.o. male presenting with multiple complaints including worsening left sided weakness.  Patient also with myoclonic/spasmodic activity.  MRI of the brain reviewed and shows acute small left MCA territory infarcts and an acute left medullary infarct.  Embolic etiology likely.  Carotid dopplers show no evidence of hemodynamically significant stenosis.  Echocardiogram pending.  A1c 5.5, LDL 75. Patient with a history of noncompliance and multisubstance abuse.  Multiple social issues that affect both.     Stroke Risk Factors - hypertension and smoking  Plan: 1. PT consult, OT consult, Speech consult 2. Echocardiogram pending 3. Baclofen 5mg  BID  4. Prophylactic therapy-Antiplatelet med: Aspirin - dose 325mg  daily 5. NPO until RN stroke swallow screen 6. Telemetry monitoring 7. Frequent neuro checks   Thana Farr, MD Neurology [phone] 04/15/2016, 12:16 PM  Addendum: It was determined by Speech therapy that the patient is a high aspiration risk.  Patient is refusing any other forms of nutrition other than po at this time.  Hopefully treatment of spasms will help with oral control.  Will refrain from controlled substance trial due to history of illicit drug abuse and social situation.     Thana Farr, MD Neurology [phone]

## 2016-04-16 DIAGNOSIS — I2510 Atherosclerotic heart disease of native coronary artery without angina pectoris: Secondary | ICD-10-CM | POA: Diagnosis present

## 2016-04-16 DIAGNOSIS — R066 Hiccough: Secondary | ICD-10-CM | POA: Diagnosis present

## 2016-04-16 DIAGNOSIS — R748 Abnormal levels of other serum enzymes: Secondary | ICD-10-CM | POA: Diagnosis not present

## 2016-04-16 DIAGNOSIS — X58XXXA Exposure to other specified factors, initial encounter: Secondary | ICD-10-CM | POA: Diagnosis not present

## 2016-04-16 DIAGNOSIS — Y92230 Patient room in hospital as the place of occurrence of the external cause: Secondary | ICD-10-CM | POA: Diagnosis not present

## 2016-04-16 DIAGNOSIS — Z515 Encounter for palliative care: Secondary | ICD-10-CM | POA: Diagnosis not present

## 2016-04-16 DIAGNOSIS — R259 Unspecified abnormal involuntary movements: Secondary | ICD-10-CM

## 2016-04-16 DIAGNOSIS — K224 Dyskinesia of esophagus: Secondary | ICD-10-CM | POA: Diagnosis present

## 2016-04-16 DIAGNOSIS — Z7189 Other specified counseling: Secondary | ICD-10-CM | POA: Diagnosis not present

## 2016-04-16 DIAGNOSIS — T17998A Other foreign object in respiratory tract, part unspecified causing other injury, initial encounter: Secondary | ICD-10-CM | POA: Diagnosis not present

## 2016-04-16 DIAGNOSIS — G253 Myoclonus: Secondary | ICD-10-CM | POA: Diagnosis present

## 2016-04-16 DIAGNOSIS — F1410 Cocaine abuse, uncomplicated: Secondary | ICD-10-CM | POA: Diagnosis present

## 2016-04-16 DIAGNOSIS — G458 Other transient cerebral ischemic attacks and related syndromes: Secondary | ICD-10-CM | POA: Diagnosis present

## 2016-04-16 DIAGNOSIS — J441 Chronic obstructive pulmonary disease with (acute) exacerbation: Secondary | ICD-10-CM | POA: Diagnosis present

## 2016-04-16 DIAGNOSIS — I639 Cerebral infarction, unspecified: Secondary | ICD-10-CM | POA: Diagnosis present

## 2016-04-16 DIAGNOSIS — I10 Essential (primary) hypertension: Secondary | ICD-10-CM | POA: Diagnosis present

## 2016-04-16 DIAGNOSIS — F17210 Nicotine dependence, cigarettes, uncomplicated: Secondary | ICD-10-CM | POA: Diagnosis present

## 2016-04-16 DIAGNOSIS — R42 Dizziness and giddiness: Secondary | ICD-10-CM | POA: Diagnosis present

## 2016-04-16 DIAGNOSIS — Z9189 Other specified personal risk factors, not elsewhere classified: Secondary | ICD-10-CM | POA: Diagnosis not present

## 2016-04-16 DIAGNOSIS — I248 Other forms of acute ischemic heart disease: Secondary | ICD-10-CM | POA: Diagnosis present

## 2016-04-16 DIAGNOSIS — Z9114 Patient's other noncompliance with medication regimen: Secondary | ICD-10-CM | POA: Diagnosis not present

## 2016-04-16 DIAGNOSIS — R000 Tachycardia, unspecified: Secondary | ICD-10-CM | POA: Diagnosis present

## 2016-04-16 DIAGNOSIS — I252 Old myocardial infarction: Secondary | ICD-10-CM | POA: Diagnosis not present

## 2016-04-16 DIAGNOSIS — E785 Hyperlipidemia, unspecified: Secondary | ICD-10-CM | POA: Diagnosis present

## 2016-04-16 DIAGNOSIS — Z590 Homelessness: Secondary | ICD-10-CM | POA: Diagnosis not present

## 2016-04-16 DIAGNOSIS — I63412 Cerebral infarction due to embolism of left middle cerebral artery: Secondary | ICD-10-CM | POA: Diagnosis present

## 2016-04-16 DIAGNOSIS — D72829 Elevated white blood cell count, unspecified: Secondary | ICD-10-CM | POA: Diagnosis present

## 2016-04-16 DIAGNOSIS — I69354 Hemiplegia and hemiparesis following cerebral infarction affecting left non-dominant side: Secondary | ICD-10-CM | POA: Diagnosis not present

## 2016-04-16 DIAGNOSIS — Z66 Do not resuscitate: Secondary | ICD-10-CM | POA: Diagnosis present

## 2016-04-16 DIAGNOSIS — E860 Dehydration: Secondary | ICD-10-CM | POA: Diagnosis present

## 2016-04-16 DIAGNOSIS — N289 Disorder of kidney and ureter, unspecified: Secondary | ICD-10-CM | POA: Diagnosis present

## 2016-04-16 DIAGNOSIS — Z9119 Patient's noncompliance with other medical treatment and regimen: Secondary | ICD-10-CM | POA: Diagnosis not present

## 2016-04-16 MED ORDER — SODIUM CHLORIDE 0.9 % IV SOLN
INTRAVENOUS | Status: DC
  Administered 2016-04-17 (×2): via INTRAVENOUS

## 2016-04-16 MED ORDER — METOPROLOL TARTRATE 25 MG PO TABS
12.5000 mg | ORAL_TABLET | Freq: Two times a day (BID) | ORAL | Status: DC
  Administered 2016-04-16 – 2016-04-17 (×2): 12.5 mg via ORAL
  Filled 2016-04-16 (×3): qty 1

## 2016-04-16 MED ORDER — BACLOFEN 10 MG PO TABS
10.0000 mg | ORAL_TABLET | Freq: Two times a day (BID) | ORAL | Status: DC
  Administered 2016-04-16 – 2016-04-17 (×2): 10 mg via ORAL
  Filled 2016-04-16 (×2): qty 1

## 2016-04-16 MED ORDER — PREDNISONE 20 MG PO TABS
40.0000 mg | ORAL_TABLET | Freq: Once | ORAL | Status: AC
  Administered 2016-04-16: 40 mg via ORAL
  Filled 2016-04-16: qty 2

## 2016-04-16 MED ORDER — LISINOPRIL 20 MG PO TABS
20.0000 mg | ORAL_TABLET | Freq: Every day | ORAL | Status: DC
Start: 2016-04-16 — End: 2016-04-17
  Administered 2016-04-16 – 2016-04-17 (×2): 20 mg via ORAL
  Filled 2016-04-16 (×2): qty 1

## 2016-04-16 NOTE — Progress Notes (Signed)
[facility] - [place] at [facility]   PATIENT NAME: Steven [name]    MR#:  [id number]  DATE OF BIRTH:  Sep 24, 1960  SUBJECTIVE:  CHIEF COMPLAINT:   Chief Complaint  Patient presents with  . Dizziness   The patient is a 56 year old Caucasian male was admitted for history significant for history of medical noncompliance, tobacco, cocaine abuse, essential hypertension, COPD, who presents to the hospital with complaints of nausea, vomiting, midsternal chest pain. He also admitted of dizziness, which is going on for the past 5 days, dysphagia. Coughing with any food. He was evaluated by speech therapist and recommended to be placed nothing by mouth due to aspiration was CONSISTENT cyst. Brain MRI revealed patchy acute to subacute white matter infarcts in the left MCA territory, and also a small acute to subacute infarct in the left PICA territory at the lateral medulla. No associated hemorrhage or mass effect. Suspect this combination reflects synchronous small vessel disease rather than sequelae of emboli. 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  The patient was seen by vascular surgery, no intervention was recommended. The patient was evaluated by neurologist, who recommended to advance baclofen due to esophageal spasm and choking on food. Patient denies any significant discomfort overall.    Review of Systems  Constitutional: Negative for chills, fever and weight loss.  HENT: Negative for congestion.   Eyes: Negative for blurred vision and double vision.  Respiratory: Negative for cough, sputum production, shortness of breath and wheezing.   Cardiovascular: Negative for chest pain, palpitations, orthopnea, leg swelling and PND.  Gastrointestinal: Negative for abdominal pain, blood in stool, constipation, diarrhea, nausea and vomiting.    Genitourinary: Negative for dysuria, frequency, hematuria and urgency.  Musculoskeletal: Negative for falls.  Neurological: Positive for dizziness and speech change. Negative for tremors, focal weakness and headaches.  Endo/Heme/Allergies: Does not bruise/bleed easily.  Psychiatric/Behavioral: Negative for depression. The patient does not have insomnia.    positive for swallowing difficulty, coughing, choking on food  VITAL SIGNS: Blood pressure (!) 173/78, pulse 85, temperature 98.8 F (37.1 C), temperature source Oral, resp. rate 16, height 5\' 4"  (1.626 m), weight 63.5 kg (140 lb), SpO2 98 %.  PHYSICAL EXAMINATION:   GENERAL:  56 y.o.-year-old patient lying in the bed in moderate to severe respiratory distress, continues to cough expectorated frothy phlegm, chokes on his own saliva. Slurred speech EYES: Pupils equal, round, reactive to light and accommodation. No scleral icterus. Extraocular muscles intact.  HEENT: Head atraumatic, normocephalic. Oropharynx and nasopharynx clear.  NECK:  Supple, no jugular venous distention. No thyroid enlargement, no tenderness.  LUNGS: Normal breath sounds bilaterally, no wheezing, rales,rhonchi or crepitation. No use of accessory muscles of respiration.  CARDIOVASCULAR: S1, S2 normal. No murmurs, rubs, or gallops.  ABDOMEN: Soft, nontender, nondistended. Bowel sounds present. No organomegaly or mass.  EXTREMITIES: No pedal edema, cyanosis, or clubbing.  NEUROLOGIC: Cranial nerves II through XII are intact. Muscle strength 5/5 in all extremities. Sensation diminished to light touch on the left. Left upper extremity. Left lower extremity near. Gait not checked. Right Sided weakness 4 out of 5 PSYCHIATRIC: The patient is alert and oriented x 3.  SKIN: No obvious rash, lesion, or ulcer.   ORDERS/RESULTS REVIEWED:   CBC  Recent Labs Lab 04/14/16 1024 04/15/16 0420  WBC 13.0* 12.9*  HGB 19.1* 15.4  HCT 54.5* 44.5  PLT 227 202  MCV 87.8 88.0  MCH  30.8 30.5  MCHC 35.1 34.7  RDW 14.0 13.2   ------------------------------------------------------------------------------------------------------------------  Chemistries   Recent Labs Lab 04/14/16 1024 04/15/16 0420  NA 139 141  K 3.7 4.2  CL 103 111  CO2 25 22  GLUCOSE 156* 112*  BUN 24* 18  CREATININE 1.36* 0.95  CALCIUM 9.8 8.7*  AST 20  --   ALT 14*  --   ALKPHOS 96  --   BILITOT 1.0  --    ------------------------------------------------------------------------------------------------------------------ estimated creatinine clearance is 73.6 mL/min (by C-G formula based on SCr of 0.95 mg/dL). ------------------------------------------------------------------------------------------------------------------ No results for input(s): TSH, T4TOTAL, T3FREE, THYROIDAB in the last 72 hours.  Invalid input(s): FREET3  Cardiac Enzymes  Recent Labs Lab 04/14/16 1024 04/14/16 1507 04/14/16 2103  TROPONINI 0.03* 0.04* 0.04*   ------------------------------------------------------------------------------------------------------------------ Invalid input(s): POCBNP ---------------------------------------------------------------------------------------------------------------  RADIOLOGY: [place] Carotid Bilateral  Result Date: 04/14/2016 CLINICAL DATA:  CVA. Slurred speech. History of hypertension and smoking. EXAM: BILATERAL CAROTID DUPLEX ULTRASOUND TECHNIQUE: Wallace CullensGray scale imaging, color Doppler and duplex ultrasound were performed of bilateral carotid and vertebral arteries in the neck. COMPARISON:  12/03/2009 FINDINGS: Criteria: Quantification of carotid stenosis is based on velocity parameters that correlate the residual internal carotid diameter with NASCET-based stenosis levels, using the diameter of the distal internal carotid lumen as the denominator for stenosis measurement. The following velocity measurements were obtained: RIGHT ICA:  96/18 cm/sec CCA:  123/17 cm/sec SYSTOLIC  ICA/CCA RATIO:  0.8 DIASTOLIC ICA/CCA RATIO:  1.1 ECA:  218 cm/sec LEFT ICA:  110/23 cm/sec CCA:  85/16 cm/sec SYSTOLIC ICA/CCA RATIO:  1.3 DIASTOLIC ICA/CCA RATIO:  1.5 ECA:  246 cm/sec RIGHT CAROTID ARTERY: There is a minimal amount of eccentric mixed echogenic partially shadowing plaque within the right carotid bulb (image 12 and 13). There is a minimal to moderate amount of eccentric mixed echogenic plaque involving the origin and proximal aspect of the right internal carotid artery (image 20), morphologically similar to the 11/2009 examination and again not resulting in elevated peak systolic velocities within the interrogated course the right internal carotid artery to suggest a hemodynamically significant stenosis. RIGHT VERTEBRAL ARTERY:  Antegrade Flow LEFT CAROTID ARTERY: There is a minimal amount of concentric intimal wall thickening / atherosclerotic plaque throughout the left common carotid artery (representative images 31 and 35). There is a moderate amount of eccentric mixed echogenic partially shadowing plaque within the left carotid bulb (image 40), extending to involve the origin and proximal aspects of the left internal carotid artery (image 49), similar to the 11/2009 examination and again not resulting in elevated peak systolic velocities within the interrogated course the left internal carotid artery to suggest a hemodynamically significant stenosis. LEFT VERTEBRAL ARTERY: Retrograde flow previously antegrade flow was demonstrated within the left vertebral artery). Note is made of asymmetrically decreased blood pressure within the left upper extremity (right upper extremity blood pressure - 150/79; left upper extremity blood pressure - 117/79) with associated monophasic waveform within the left subclavian artery (image 65). IMPRESSION: 1. Minimal to moderate amount of bilateral atherosclerotic plaque, left greater than right, morphologically similar to the 11/2009 examination and again not  resulting in a hemodynamically significant stenosis within either internal carotid artery. 2. Retrograde flow now demonstrated within the left vertebral artery with associated asymmetrically decreased blood pressure within the left upper extremity - constellation of findings worrisome for subclavian steal syndrome. Further evaluation CTA could performed as clinically indicated. Electronically Signed   By: Simonne ComeJohn  Watts M.D.   On:  04/14/2016 19:54    EKG:  Orders placed or performed during the hospital encounter of 04/14/16  . ED EKG  . ED EKG  . EKG 12-Lead  . EKG 12-Lead  . EKG 12-Lead  . EKG 12-Lead    ASSESSMENT AND PLAN:  Active Problems:   Chest pain   Cerebrovascular accident (CVA) (HCC)   At high risk for aspiration   Palliative care by specialist   Goals of care, counseling/discussion 1 Acute stroke with swallowing difficulties, ataxia, falls, patient was evaluated by speech therapist therapist recommended nothing by mouth, however, refused PEG  placement, palliative care discussed with patient , he decided on DO NOT RESUSCITATE, patient was going to be allowed to eat dysphagia 3 diet with thickened liquids. His echocardiogram revealed septal wall motion abnormalities, ejection fraction of 50-55%, The patient was seen by cardiologist, recommended aspirin, consider TEE due to concerns of embolic phenomenon. Initiated patient on statin. TEE today by Dr. Gwen Pounds. Dysphagia was felt to be related to esophageal spasm, in partial, by Dr. Thad Ranger, now on advanced baclofen dose, follow clinically. Initiate OT, PT consultation #2. Dysphagia, patient was recommended to be nothing by mouth, since had aspiration with all consistencies. However, decided against PEG tube placement, now he is DO NOT RESUSCITATE, on dysphagia 3 diet with thickened liquids, aspiration precautions at all times . Patient's dysphagia was felt to be related to esophageal spasm, at least in part, by Dr. Thad Ranger, now on  advanced baclofen dose, follow clinically #3 . Acute renal insufficiency, resolved #4. Elevated troponin, aspirin, Lipitor, initiate low-dose of beta blocker #5 hypertension, discontinue IV fluids, watching for fluid overload,  initiate patient on metoprolol,restart Prinivil #6. 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   DRUG ALLERGIES:  Allergies  Allergen Reactions  . Statins     Other reaction(s): Other (See Comments) Weakness    CODE STATUS:     Code Status Orders        Start     Ordered   04/15/16 1457  Do not attempt resuscitation (DNR)  Continuous    Question Answer Comment  In the event of cardiac or respiratory ARREST Do not call a "code blue"   In the event of cardiac or respiratory ARREST Do not perform Intubation, CPR, defibrillation or ACLS   In the event of cardiac or respiratory ARREST Use medication by any route, position, wound care, and other measures to relive pain and suffering. May use oxygen, suction and manual treatment of airway obstruction as needed for comfort.      04/15/16 1456    Code Status History    Date Active Date Inactive Code Status Order ID Comments User Context   04/14/2016 11:29 AM 04/15/2016  2:56 PM Full Code [id number]  Milagros Loll, MD ED      TOTAL Critical care TIME TAKING CARE OF THIS PATIENT: 45 minutes.   Discussed with palliative care, speech therapy, Management   Quadasia Newsham M.D on 04/16/2016 at 4:37 PM  Between 7am to 6pm - Pager - [phone]  After 6pm go to [email or web address] - password [email or web address] [facility]  [place] [facility]  Office   [phone]  CC: Primary care physician; No PCP Per Patient

## 2016-04-16 NOTE — Progress Notes (Signed)
Daily Progress Note   Patient Name: Steven [name]       Date: 04/16/2016 DOB: Mar 10, 1960  Age: 56 y.o. MRN#: [id number] Attending Physician: Theodoro Grist, MD Primary Care Physician: No PCP Per Patient Admit Date: 04/14/2016  Reason for Consultation/Follow-up: Establishing goals of care  Subjective: Patient awake and alert. He has hiccups but this episode is not like when I met him yesterday. No complaints of pain or discomfort. He tells me he is not having trouble eating or with swallow. Explained to him that he is likely silently aspirating and may not cause immediate issues but can lead to future aspiration pneumonia. Answered questions and provided support.   Length of Stay: 0  Current Medications: Scheduled Meds:  . aspirin EC  81 mg Oral Daily  . atorvastatin  40 mg Oral q1800  . baclofen  10 mg Oral BID  . chlorhexidine  15 mL Mouth Rinse BID  . enoxaparin (LOVENOX) injection  40 mg Subcutaneous Q24H  . ipratropium-albuterol  3 mL Nebulization Once  . lisinopril  10 mg Oral Daily  . mouth rinse  15 mL Mouth Rinse q12n4p  . sodium chloride flush  3 mL Intravenous Q12H    Continuous Infusions: . 0.9 % NaCl with KCl 20 mEq / L 100 mL/hr at 04/16/16 0336    PRN Meds: acetaminophen **OR** acetaminophen, albuterol, ibuprofen, ondansetron **OR** ondansetron (ZOFRAN) IV, polyethylene glycol  Physical Exam  Constitutional: He is oriented to person, place, and time. He is cooperative.  HENT:  Head: Normocephalic and atraumatic.  Cardiovascular: Regular rhythm.   Pulmonary/Chest: He has decreased breath sounds.  hiccups  Abdominal: Normal appearance.  Neurological: He is alert and oriented to person, place, and time.  Skin: Skin is warm and dry.  Psychiatric: He has a normal  mood and affect. His behavior is normal. His speech is slurred.  Nursing note and vitals reviewed.          Vital Signs: BP (!) 173/78 (BP Location: Right Arm)   Pulse 85   Temp 98.8 F (37.1 C) (Oral)   Resp 16   Ht 5' 4"  (1.626 m)   Wt 63.5 kg (140 lb)   SpO2 98%   BMI 24.03 kg/m  SpO2: SpO2: 98 % O2 Device: O2 Device: Not Delivered O2 Flow Rate:  Intake/output summary:  Intake/Output Summary (Last 24 hours) at 04/16/16 1222 Last data filed at 04/16/16 1148  Gross per 24 hour  Intake              360 ml  Output             1225 ml  Net             -865 ml   LBM: Last BM Date: 04/11/16 Baseline Weight: Weight: 74.8 kg (165 lb) Most recent weight: Weight: 63.5 kg (140 lb)       Palliative Assessment/Data: PPS 40%   Flowsheet Rows     Most Recent Value  Intake Tab  Referral Department  Hospitalist  Unit at Time of Referral  Cardiac/Telemetry Unit  Palliative Care Primary Diagnosis  Neurology  Palliative Care Type  New Palliative care  Reason for referral  Clarify Goals of Care  Date of Admission  04/14/16  Date first seen by Palliative Care  04/15/16  Clinical Assessment  Palliative Performance Scale Score  40%  Psychosocial & Spiritual Assessment  Palliative Care Outcomes  Patient/Family meeting held?  Yes  Who was at the meeting?  patient and daughter via telephone  Palliative Care Outcomes  Clarified goals of care, Provided psychosocial or spiritual support, ACP counseling assistance, Changed CPR status      Patient Active Problem List   Diagnosis Date Noted  . Cerebrovascular accident (CVA) ([place])   . At high risk for aspiration   . Palliative care by specialist   . Goals of care, counseling/discussion   . Chest pain 04/14/2016    Palliative Care Assessment & Plan   Patient Profile: 56 y.o. male  with past medical history of hypertension and CVA admitted on 04/14/2016 with dizziness, nausea/vomiting, and chest pain. Also with worsening left sided  weakness. Neurology following. 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 Carotid dopplers negative for significant stenosis. Elevated troponin likely demand ischemia. Cardiology following. ECHO pending. Telemetry to monitor for afib. Patient has been evaluated by speech therapy and is very high risk for aspiration, therefore recommending NPO. Patient with history of noncompliance and substance abuse. Patient continuously asking to eat. Dysphagia 3 diet placed-patient and daughter understand risk for aspiration and decompensation.   Assessment: Acute stroke Dysphagia High risk for aspiration Hypertension Acute renal insufficiency Leukocytosis  Recommendations/Plan:  DNR/DNI  Continue diet. Patient and daughter understand risk for aspiration.   I do not feel he is appropriated for a hospice facility. Discussed with Dr. Ether Griffins. If patient agreeable to short term rehab, may benefit from palliative to follow outpatient.   PMT will continue to shadow chart and support patient through hospitalization.   Code Status: DNR   Code Status Orders        Start     Ordered   04/15/16 2122  Do not attempt resuscitation (DNR)  Continuous    Question Answer Comment  In the event of cardiac or respiratory ARREST Do not call a "code blue"   In the event of cardiac or respiratory ARREST Do not perform Intubation, CPR, defibrillation or ACLS   In the event of cardiac or respiratory ARREST Use medication by any route, position, wound care, and other measures to relive pain and suffering. May use oxygen, suction and manual treatment of airway obstruction as needed for comfort.      04/15/16 1456    Code Status History    Date Active Date Inactive Code  Status Order ID Comments User Context   04/14/2016 11:29 AM 04/15/2016  2:56 PM Full Code [id number]  Hillary Bow, MD ED       Prognosis:   Unable to determine  Discharge Planning:  To Be  Determined  Care plan was discussed with patient, RN, RN CM, and Dr. Ether Griffins  Thank you for allowing the Palliative Medicine Team to assist in the care of this patient.   Time In: 1220 Time Out: 1255 Total Time 47mn Prolonged Time Billed no      Greater than 50%  of this time was spent counseling and coordinating care related to the above assessment and plan.  MIhor Dow FNP-C Palliative Medicine Team  Phone: [phone]: [phone] Please contact Palliative Medicine Team phone at [phone] questions and concerns.

## 2016-04-16 NOTE — Progress Notes (Addendum)
Gave patient educational handout r/t diagnosis of recent CVA. Patient stated would attempt to look over later. Steven FavreSteven M Randal Goens   Also cleaned patient's chest, gave patient a new gown & new blanket. Steven FavreSteven M Eye [facility] LLCmhoff

## 2016-04-16 NOTE — Care Management (Addendum)
Very difficult for patient to carry on conversation due to constant clearing of throat and cough.  Patient says  this address on his face sheet is that of his daughter- Steven [name].  Patient says [place] "[place] in [place] the [place] time" and lives at "that place [place] [facility] [employer]  He is unable to state if it is a hotel. CM asked if he would be able to discharge to live with his daughter and he responds "no" . Gave CM permission to speak with Steven [name]. Patient has lived on the streets for  years and she has accepted he is not going to change.  She is aware of patient's current health status.  Patient may agree to short term nursing home placement but "would never" agree to giving his check to anyone when CM suggested assisted living supervised environment.  Discussed whether patient has attempted to sign up for medicare.  He has been disabled for approx 8 years.  Unsure if anyone has attempted to sign patient up for medicare.  Steven [name] does not know if patient has ever tried.  he probably has not.  [facility] and Steven [name] would travel to [place] to make sure he kept appointments but patient has become more noncompliant.  Patient would not qualify for home health in the home in the home under medicaid.  [facility] to "[facility]having" in the past.  He does get a room at the hotel across the street from [employer]- mainly during the winter.  REquesting PT/OT consult to see if patient would qualify for skilled nursing.

## 2016-04-16 NOTE — Progress Notes (Signed)
[facility]  Patient: Steven [name] / Admit Date: 04/14/2016 / Date of Encounter: 04/16/2016, 8:07 AM   Subjective: Patient has some spasms today but no evidence of chest pain shortness of breath or other cardiovascular symptoms. Troponin is minimal which may be most consistent with demand ischemia rather than acute coronary syndrome. Patient has some wheezing today with shortness of breath  Review of Systems: Positive for: Shortness of breath and wheezing Negative for: Vision change, hearing change, syncope, dizziness, nausea, vomiting,diarrhea, bloody stool, stomach pain, cough, congestion, diaphoresis, urinary frequency, urinary pain,skin lesions, skin rashes Others previously listed  Objective: Telemetry: Normal sinus rhythm Physical Exam: Blood pressure (!) 162/86, pulse 88, temperature 98.9 F (37.2 C), temperature source Oral, resp. rate 18, height 5\' 4"  (1.626 m), weight 63.5 kg (140 lb), SpO2 99 %. Body mass index is 24.03 kg/m. General: Well developed, well nourished, in no acute distress. Head: Normocephalic, atraumatic, sclera non-icteric, no xanthomas, nares are without discharge. Neck: No apparent masses Lungs: Normal respirations with diffuse wheezes, no rhonchi, no rales , basilar crackles   Heart: Regular rate and rhythm, normal S1 S2, no murmur, no rub, no gallop, PMI is normal size and placement, carotid upstroke normal without bruit, jugular venous pressure normal Abdomen: Soft, non-tender, non-distended with normoactive bowel sounds. No hepatosplenomegaly. Abdominal aorta is normal size without bruit Extremities: No edema, no clubbing, no cyanosis, no ulcers,  Peripheral: 2+ radial, 2+ femoral, 2+ dorsal pedal pulses Neuro: Alert and oriented. Moves all extremities spontaneously. Psych:  Responds to questions appropriately with a normal affect.   Intake/Output Summary (Last 24 hours) at 04/16/16 0807 Last data filed at  04/16/16 [id number]  Gross per 24 hour  Intake                0 ml  Output              925 ml  Net             -925 ml    Inpatient Medications:  . aspirin EC  81 mg Oral Daily  . atorvastatin  40 mg Oral q1800  . baclofen  5 mg Oral BID  . chlorhexidine  15 mL Mouth Rinse BID  . enoxaparin (LOVENOX) injection  40 mg Subcutaneous Q24H  . ipratropium-albuterol  3 mL Nebulization Once  . lisinopril  10 mg Oral Daily  . mouth rinse  15 mL Mouth Rinse q12n4p  . predniSONE  40 mg Oral Once  . sodium chloride flush  3 mL Intravenous Q12H   Infusions:  . 0.9 % NaCl with KCl 20 mEq / L 100 mL/hr at 04/16/16 0336    Labs:  Recent Labs  04/14/16 1024 04/15/16 0420  NA 139 141  K 3.7 4.2  CL 103 111  CO2 25 22  GLUCOSE 156* 112*  BUN 24* 18  CREATININE 1.36* 0.95  CALCIUM 9.8 8.7*    Recent Labs  04/14/16 1024  AST 20  ALT 14*  ALKPHOS 96  BILITOT 1.0  PROT 8.1  ALBUMIN 4.5    Recent Labs  04/14/16 1024 04/15/16 0420  WBC 13.0* 12.9*  HGB 19.1* 15.4  HCT 54.5* 44.5  MCV 87.8 88.0  PLT 227 202    Recent Labs  04/14/16 1024 04/14/16 1151 04/14/16 1507 04/14/16 2103  CKTOTAL  --  140  --   --   TROPONINI 0.03*  --  0.04* 0.04*   Invalid input(s): POCBNP  Recent Labs  04/14/16 1151  HGBA1C 5.5     Weights: Filed Weights   04/14/16 1340 04/15/16 0336 04/16/16 0559  Weight: 64 kg (141 lb 3.2 oz) 64 kg (141 lb) 63.5 kg (140 lb)     Radiology/Studies:  Dg Eye Foreign Body  Result Date: 04/14/2016 CLINICAL DATA:  Screening for MRI. EXAM: ORBITS FOR FOREIGN BODY - 2 VIEW COMPARISON:  CT 04/14/2016 . FINDINGS: No radiopaque foreign bodies noted within the orbits. Patient cleared for MRI. Paranasal sinuses clear. No acute bony abnormality . IMPRESSION: No evidence of metallic foreign body within the orbits. Electronically Signed   By: Maisie Fus  Register   On: 04/14/2016 12:27   Dg Chest 2 View  Result Date: 04/14/2016 CLINICAL DATA:  Cough. EXAM: CHEST  2  VIEW COMPARISON:  Radiograph of December 02, 2009. FINDINGS: The heart size and mediastinal contours are within normal limits. Both lungs are clear. No pneumothorax or pleural effusion is noted. The visualized skeletal structures are unremarkable. IMPRESSION: No active cardiopulmonary disease. Electronically Signed   By: Lupita Raider, M.D.   On: 04/14/2016 10:14   Ct Head Wo Contrast  Result Date: 04/14/2016 CLINICAL DATA:  Dizziness EXAM: CT HEAD WITHOUT CONTRAST TECHNIQUE: Contiguous axial images were obtained from the base of the skull through the vertex without intravenous contrast. COMPARISON:  11/09/2012 FINDINGS: Brain: Mild atrophic changes are noted. Mild chronic white matter ischemic change is seen. Findings consistent with prior lacunar infarct are noted in the head of the caudate nucleus on the right as well as on the left and in the deep centrum semi ovale adjacent to the head of the caudate nucleus on the left. These changes are new from the prior exam. No acute infarct, acute hemorrhage or space-occupying mass lesion is noted. Vascular: No hyperdense vessel or unexpected calcification. Skull: Normal. Negative for fracture or focal lesion. Sinuses/Orbits: No acute finding. Other: None. IMPRESSION: Chronic atrophic and ischemic changes without acute abnormality. Electronically Signed   By: Alcide Clever M.D.   On: 04/14/2016 10:23   Mr Brain Wo Contrast  Result Date: 04/14/2016 CLINICAL DATA:  56 year old male with 3 days of nausea vomiting and onset of dizziness. Left side numbness. Initial encounter. EXAM: MRI HEAD WITHOUT CONTRAST TECHNIQUE: Multiplanar, multiecho pulse sequences of the brain and surrounding structures were obtained without intravenous contrast. COMPARISON:  Head CT without contrast 1005 hours today. Brain MRI 12/03/2009. FINDINGS: Brain: Patchy and small scattered areas of abnormal diffusion in the left corona radiata and surrounding subcortical white matter, including in  the posterior left temporal lobe. The most confluent area is about 15 mm. ADC demonstrates a mix of restricted and isointense diffusion in these areas. There is associated T2 and FLAIR hyperintensity with no mass effect or hemorrhage. Scattered additional nonspecific cerebral white matter signal changes in both hemispheres. Superimposed multiple chronic lacunar infarcts in the bilateral deep gray matter nuclei and lower pons. There is also a small 6 mm focus of abnormal diffusion in the left lateral medulla (series 100 image 10) which appears mildly restricted. Associated T2 and FLAIR hyperintensity without mass effect or definite hemorrhage. Superimposed several chronic small lacunar infarcts in the bilateral cerebellar hemispheres. No cerebral cortical encephalomalacia and no definite chronic cerebral blood products. No midline shift, mass effect, evidence of mass lesion, ventriculomegaly, extra-axial collection or acute intracranial hemorrhage. Cervicomedullary junction and pituitary are within normal limits. 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 Skull and upper cervical spine: Grossly  negative. Sinuses/Orbits: Leftward gaze deviation. Otherwise negative orbits soft tissues. Visualized paranasal sinuses and mastoids are stable and well pneumatized. Other: Visible internal auditory structures appear normal. Negative scalp soft tissues. IMPRESSION: 1. Patchy acute to subacute white matter infarcts in the left MCA territory, and also a small acute to subacute infarct in the left PICA territory at the lateral medulla. No associated hemorrhage or mass effect. Suspect this combination reflects synchronous small vessel disease rather than sequelae of emboli. 2. Underlying advanced for age chronic small vessel disease including multiple chronic lacunar infarcts in the bilateral basal ganglia, thalami, and brainstem. 3. Mild intracranial  artery dolichoectasia. Electronically Signed   By: Odessa Fleming M.D.   On: 04/14/2016 13:19   US Carotid Bilateral  Result Date: 04/14/2016 CLINICAL DATA:  CVA. Slurred speech. History of hypertension and smoking. EXAM: BILATERAL CAROTID DUPLEX ULTRASOUND TECHNIQUE: Wallace Cullens scale imaging, color Doppler and duplex ultrasound were performed of bilateral carotid and vertebral arteries in the neck. COMPARISON:  12/03/2009 FINDINGS: Criteria: Quantification of carotid stenosis is based on velocity parameters that correlate the residual internal carotid diameter with NASCET-based stenosis levels, using the diameter of the distal internal carotid lumen as the denominator for stenosis measurement. The following velocity measurements were obtained: RIGHT ICA:  96/18 cm/sec CCA:  123/17 cm/sec SYSTOLIC ICA/CCA RATIO:  0.8 DIASTOLIC ICA/CCA RATIO:  1.1 ECA:  218 cm/sec LEFT ICA:  110/23 cm/sec CCA:  85/16 cm/sec SYSTOLIC ICA/CCA RATIO:  1.3 DIASTOLIC ICA/CCA RATIO:  1.5 ECA:  246 cm/sec RIGHT CAROTID ARTERY: There is a minimal amount of eccentric mixed echogenic partially shadowing plaque within the right carotid bulb (image 12 and 13). There is a minimal to moderate amount of eccentric mixed echogenic plaque involving the origin and proximal aspect of the right internal carotid artery (image 20), morphologically similar to the 11/2009 examination and again not resulting in elevated peak systolic velocities within the interrogated course the right internal carotid artery to suggest a hemodynamically significant stenosis. RIGHT VERTEBRAL ARTERY:  Antegrade Flow LEFT CAROTID ARTERY: There is a minimal amount of concentric intimal wall thickening / atherosclerotic plaque throughout the left common carotid artery (representative images 31 and 35). There is a moderate amount of eccentric mixed echogenic partially shadowing plaque within the left carotid bulb (image 40), extending to involve the origin and proximal aspects of the left  internal carotid artery (image 49), similar to the 11/2009 examination and again not resulting in elevated peak systolic velocities within the interrogated course the left internal carotid artery to suggest a hemodynamically significant stenosis. LEFT VERTEBRAL ARTERY: Retrograde flow previously antegrade flow was demonstrated within the left vertebral artery). Note is made of asymmetrically decreased blood pressure within the left upper extremity (right upper extremity blood pressure - 150/79; left upper extremity blood pressure - 117/79) with associated monophasic waveform within the left subclavian artery (image 65). IMPRESSION: 1. Minimal to moderate amount of bilateral atherosclerotic plaque, left greater than right, morphologically similar to the 11/2009 examination and again not resulting in a hemodynamically significant stenosis within either internal carotid artery. 2. Retrograde flow now demonstrated within the left vertebral artery with associated asymmetrically decreased blood pressure within the left upper extremity - constellation of findings worrisome for subclavian steal syndrome. Further evaluation CTA could performed as clinically indicated. Electronically Signed   By: Simonne Come M.D.   On: 04/14/2016 19:54     Assessment and Recommendation  56 y.o. male with the current evidence of stroke possibly embolic in nature with known  COPD tobacco abuse essential hypertension and previous cardiovascular disease with possible previous myocardial infarction currently improving. There is no current evidence of atrial fibrillation or rhythm disturbances 1. Continue high intensity cholesterol therapy for further risk reduction cardiovascular event 2. Hypertension control with a goal systolic blood pressure below 161 mm with ACE inhibitor 3. Continue monitoring for heart rate control and concerns of atrial fibrillation which could cause embolic phenomenon 4. Antiplatelet medication management and  further consideration of further intervention as necessary for embolic phenomenon including transesophageal echocardiogram and/or other outpatient monitoring as needed  Signed, Arnoldo Hooker M.D. FACC

## 2016-04-16 NOTE — Plan of Care (Signed)
Problem: Education: Goal: Knowledge of disease or condition will improve Outcome: Progressing Will provide paper handout education r/t CVA this evening.   Problem: Coping: Goal: Ability to identify appropriate support needs will improve Outcome: Not Progressing Poor medication compliance.   Problem: Nutrition: Goal: Risk of aspiration will decrease Outcome: Not Progressing Refuses recommended diet.

## 2016-04-16 NOTE — Progress Notes (Signed)
Subjective: Patient reports good improvement after Baclofen last evening but less robust response this morning.  No sedation.  Objective: Current vital signs: BP (!) 173/78 (BP Location: Right Arm)   Pulse 85   Temp 98.8 F (37.1 C) (Oral)   Resp 16   Ht 5\' 4"  (1.626 m)   Wt 63.5 kg (140 lb)   SpO2 98%   BMI 24.03 kg/m  Vital signs in last 24 hours: Temp:  [98.8 F (37.1 C)-99.3 F (37.4 C)] 98.8 F (37.1 C) (03/22 1148) Pulse Rate:  [83-88] 85 (03/22 1148) Resp:  [16-19] 16 (03/22 1148) BP: (95-173)/(56-86) 173/78 (03/22 1148) SpO2:  [91 %-99 %] 98 % (03/22 1148) Weight:  [63.5 kg (140 lb)] 63.5 kg (140 lb) (03/22 0559)  Intake/Output from previous day: 03/21 0701 - 03/22 0700 In: -  Out: 925 [Urine:925] Intake/Output this shift: Total I/O In: 360 [P.O.:360] Out: -  Nutritional status: DIET DYS 3 Room service appropriate? Yes; Fluid consistency: Nectar Thick  Neurologic Exam: Mental Status: Alert, oriented.  Speech fluent without evidence of aphasia.  Able to follow 3 step commands without difficulty. Cranial Nerves: II: Discs flat bilaterally; Visual fields grossly normal, pupils equal, round, reactive to light and accommodation III,IV, VI: ptosis not present, extra-ocular motions intact bilaterally V,VII: decrease in right NLF, facial light touch sensation normal bilaterally VIII: hearing normal bilaterally IX,X: gag reflex present XI: bilateral shoulder shrug XII: midline tongue extension Motor: Right :  Upper extremity   5/5                                      Left:     Upper extremity   5/5             Lower extremity   4/5                                                  Lower extremity   5/5 Tone and bulk:normal tone throughout; no atrophy noted.  Myoclonus improved.  Frequent hiccups.   Lab Results: Basic Metabolic Panel:  Recent Labs Lab 04/14/16 1024 04/15/16 0420  NA 139 141  K 3.7 4.2  CL 103 111  CO2 25 22  GLUCOSE 156* 112*  BUN 24* 18   CREATININE 1.36* 0.95  CALCIUM 9.8 8.7*    Liver Function Tests:  Recent Labs Lab 04/14/16 1024  AST 20  ALT 14*  ALKPHOS 96  BILITOT 1.0  PROT 8.1  ALBUMIN 4.5   No results for input(s): LIPASE, AMYLASE in the last 168 hours. No results for input(s): AMMONIA in the last 168 hours.  CBC:  Recent Labs Lab 04/14/16 1024 04/15/16 0420  WBC 13.0* 12.9*  HGB 19.1* 15.4  HCT 54.5* 44.5  MCV 87.8 88.0  PLT 227 202    Cardiac Enzymes:  Recent Labs Lab 04/14/16 1024 04/14/16 1151 04/14/16 1507 04/14/16 2103  CKTOTAL  --  140  --   --   TROPONINI 0.03*  --  0.04* 0.04*    Lipid Panel:  Recent Labs Lab 04/15/16 0420  CHOL 125  TRIG 117  HDL 27*  CHOLHDL 4.6  VLDL 23  LDLCALC 75    CBG: No results for input(s): GLUCAP in the last 168 hours.  Microbiology: No results found for this or any previous visit.  Coagulation Studies: No results for input(s): LABPROT, INR in the last 72 hours.  Imaging: Dg Eye Foreign Body  Result Date: 04/14/2016 CLINICAL DATA:  Screening for MRI. EXAM: ORBITS FOR FOREIGN BODY - 2 VIEW COMPARISON:  CT 04/14/2016 . FINDINGS: No radiopaque foreign bodies noted within the orbits. Patient cleared for MRI. Paranasal sinuses clear. No acute bony abnormality . IMPRESSION: No evidence of metallic foreign body within the orbits. Electronically Signed   By: Maisie Fushomas  Register   On: 04/14/2016 12:27   Mr Brain Wo Contrast  Result Date: 04/14/2016 CLINICAL DATA:  56 year old male with 3 days of nausea vomiting and onset of dizziness. Left side numbness. Initial encounter. EXAM: MRI HEAD WITHOUT CONTRAST TECHNIQUE: Multiplanar, multiecho pulse sequences of the brain and surrounding structures were obtained without intravenous contrast. COMPARISON:  Head CT without contrast 1005 hours today. Brain MRI 12/03/2009. FINDINGS: Brain: Patchy and small scattered areas of abnormal diffusion in the left corona radiata and surrounding subcortical white  matter, including in the posterior left temporal lobe. The most confluent area is about 15 mm. ADC demonstrates a mix of restricted and isointense diffusion in these areas. There is associated T2 and FLAIR hyperintensity with no mass effect or hemorrhage. Scattered additional nonspecific cerebral white matter signal changes in both hemispheres. Superimposed multiple chronic lacunar infarcts in the bilateral deep gray matter nuclei and lower pons. There is also a small 6 mm focus of abnormal diffusion in the left lateral medulla (series 100 image 10) which appears mildly restricted. Associated T2 and FLAIR hyperintensity without mass effect or definite hemorrhage. Superimposed several chronic small lacunar infarcts in the bilateral cerebellar hemispheres. No cerebral cortical encephalomalacia and no definite chronic cerebral blood products. No midline shift, mass effect, evidence of mass lesion, ventriculomegaly, extra-axial collection or acute intracranial hemorrhage. Cervicomedullary junction and pituitary are within normal limits. 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 Skull and upper cervical spine: Grossly negative. Sinuses/Orbits: Leftward gaze deviation. Otherwise negative orbits soft tissues. Visualized paranasal sinuses and mastoids are stable and well pneumatized. Other: Visible internal auditory structures appear normal. Negative scalp soft tissues. IMPRESSION: 1. Patchy acute to subacute white matter infarcts in the left MCA territory, and also a small acute to subacute infarct in the left PICA territory at the lateral medulla. No associated hemorrhage or mass effect. Suspect this combination reflects synchronous small vessel disease rather than sequelae of emboli. 2. Underlying advanced for age chronic small vessel disease including multiple chronic lacunar infarcts in the bilateral basal ganglia, thalami, and brainstem. 3.  Mild intracranial artery dolichoectasia. Electronically Signed   By: Odessa FlemingH  Hall M.D.   On: 04/14/2016 13:19   [place] Carotid Bilateral  Result Date: 04/14/2016 CLINICAL DATA:  CVA. Slurred speech. History of hypertension and smoking. EXAM: BILATERAL CAROTID DUPLEX ULTRASOUND TECHNIQUE: Wallace CullensGray scale imaging, color Doppler and duplex ultrasound were performed of bilateral carotid and vertebral arteries in the neck. COMPARISON:  12/03/2009 FINDINGS: Criteria: Quantification of carotid stenosis is based on velocity parameters that correlate the residual internal carotid diameter with NASCET-based stenosis levels, using the diameter of the distal internal carotid lumen as the denominator for stenosis measurement. The following velocity measurements were obtained: RIGHT ICA:  96/18 cm/sec CCA:  123/17 cm/sec SYSTOLIC ICA/CCA RATIO:  0.8 DIASTOLIC ICA/CCA RATIO:  1.1 ECA:  218 cm/sec LEFT ICA:  110/23 cm/sec CCA:  85/16 cm/sec SYSTOLIC ICA/CCA RATIO:  1.3  DIASTOLIC ICA/CCA RATIO:  1.5 ECA:  246 cm/sec RIGHT CAROTID ARTERY: There is a minimal amount of eccentric mixed echogenic partially shadowing plaque within the right carotid bulb (image 12 and 13). There is a minimal to moderate amount of eccentric mixed echogenic plaque involving the origin and proximal aspect of the right internal carotid artery (image 20), morphologically similar to the 11/2009 examination and again not resulting in elevated peak systolic velocities within the interrogated course the right internal carotid artery to suggest a hemodynamically significant stenosis. RIGHT VERTEBRAL ARTERY:  Antegrade Flow LEFT CAROTID ARTERY: There is a minimal amount of concentric intimal wall thickening / atherosclerotic plaque throughout the left common carotid artery (representative images 31 and 35). There is a moderate amount of eccentric mixed echogenic partially shadowing plaque within the left carotid bulb (image 40), extending to involve the origin and proximal  aspects of the left internal carotid artery (image 49), similar to the 11/2009 examination and again not resulting in elevated peak systolic velocities within the interrogated course the left internal carotid artery to suggest a hemodynamically significant stenosis. LEFT VERTEBRAL ARTERY: Retrograde flow previously antegrade flow was demonstrated within the left vertebral artery). Note is made of asymmetrically decreased blood pressure within the left upper extremity (right upper extremity blood pressure - 150/79; left upper extremity blood pressure - 117/79) with associated monophasic waveform within the left subclavian artery (image 65). IMPRESSION: 1. Minimal to moderate amount of bilateral atherosclerotic plaque, left greater than right, morphologically similar to the 11/2009 examination and again not resulting in a hemodynamically significant stenosis within either internal carotid artery. 2. Retrograde flow now demonstrated within the left vertebral artery with associated asymmetrically decreased blood pressure within the left upper extremity - constellation of findings worrisome for subclavian steal syndrome. Further evaluation CTA could performed as clinically indicated. Electronically Signed   By: Simonne Come M.D.   On: 04/14/2016 19:54    Medications:  I have reviewed the patient's current medications. Scheduled: . aspirin EC  81 mg Oral Daily  . atorvastatin  40 mg Oral q1800  . baclofen  10 mg Oral BID  . chlorhexidine  15 mL Mouth Rinse BID  . enoxaparin (LOVENOX) injection  40 mg Subcutaneous Q24H  . ipratropium-albuterol  3 mL Nebulization Once  . lisinopril  10 mg Oral Daily  . mouth rinse  15 mL Mouth Rinse q12n4p  . sodium chloride flush  3 mL Intravenous Q12H    Assessment/Plan: Spasms improving but today continuing to interfere with po intake.  Patient tolerating Baclofen.    Recommendations: 1.  Increase Baclofen to 10mg  BID   LOS: 0 days   Thana Farr,  MD Neurology [id number] 04/16/2016  12:02 PM

## 2016-04-17 ENCOUNTER — Inpatient Hospital Stay
Admit: 2016-04-17 | Discharge: 2016-04-17 | Disposition: A | Discharge status: Home / Self Care | Payer: Medicaid Other | Attending: Internal Medicine | Admitting: Internal Medicine

## 2016-04-17 ENCOUNTER — Encounter
Admission: EM | Disposition: A | Discharge status: Home / Self Care | Payer: Self-pay | Source: Home / Self Care | Attending: Internal Medicine

## 2016-04-17 DIAGNOSIS — R778 Other specified abnormalities of plasma proteins: Secondary | ICD-10-CM

## 2016-04-17 DIAGNOSIS — R748 Abnormal levels of other serum enzymes: Secondary | ICD-10-CM

## 2016-04-17 DIAGNOSIS — R1310 Dysphagia, unspecified: Secondary | ICD-10-CM

## 2016-04-17 DIAGNOSIS — N289 Disorder of kidney and ureter, unspecified: Secondary | ICD-10-CM

## 2016-04-17 DIAGNOSIS — K224 Dyskinesia of esophagus: Secondary | ICD-10-CM

## 2016-04-17 DIAGNOSIS — R7989 Other specified abnormal findings of blood chemistry: Secondary | ICD-10-CM

## 2016-04-17 DIAGNOSIS — D72829 Elevated white blood cell count, unspecified: Secondary | ICD-10-CM

## 2016-04-17 DIAGNOSIS — I10 Essential (primary) hypertension: Secondary | ICD-10-CM

## 2016-04-17 HISTORY — PX: TEE WITHOUT CARDIOVERSION: SHX5443

## 2016-04-17 LAB — URINE CULTURE: Culture: NO GROWTH

## 2016-04-17 SURGERY — ECHOCARDIOGRAM, TRANSESOPHAGEAL
Anesthesia: Moderate Sedation

## 2016-04-17 MED ORDER — METOPROLOL TARTRATE 25 MG PO TABS: 12.5000 mg | ORAL_TABLET | Freq: Two times a day (BID) | ORAL | 3 refills | Status: DC

## 2016-04-17 MED ORDER — FENTANYL CITRATE (PF) 100 MCG/2ML IJ SOLN
INTRAMUSCULAR | Status: AC
  Filled 2016-04-17: qty 2

## 2016-04-17 MED ORDER — LIDOCAINE VISCOUS 2 % MT SOLN
OROMUCOSAL | Status: AC
  Filled 2016-04-17: qty 15

## 2016-04-17 MED ORDER — BACLOFEN 10 MG PO TABS: 10.0000 mg | ORAL_TABLET | Freq: Two times a day (BID) | ORAL | 0 refills | Status: DC

## 2016-04-17 MED ORDER — ASPIRIN 81 MG PO TBEC: 81.0000 mg | DELAYED_RELEASE_TABLET | Freq: Every day | ORAL | 3 refills | Status: DC

## 2016-04-17 MED ORDER — FENTANYL CITRATE (PF) 100 MCG/2ML IJ SOLN
INTRAMUSCULAR | Status: AC | PRN
  Administered 2016-04-17: 25 ug via INTRAVENOUS
  Administered 2016-04-17: 50 ug via INTRAVENOUS

## 2016-04-17 MED ORDER — MIDAZOLAM HCL 5 MG/5ML IJ SOLN
INTRAMUSCULAR | Status: AC | PRN
  Administered 2016-04-17: 2 mg via INTRAVENOUS

## 2016-04-17 MED ORDER — MIDAZOLAM HCL 5 MG/5ML IJ SOLN
INTRAMUSCULAR | Status: AC
  Filled 2016-04-17: qty 5

## 2016-04-17 NOTE — Evaluation (Signed)
Physical Therapy Evaluation Patient Details Name: Steven [name] MRN: [id number] DOB: 1960-12-01 Today's Date: 04/17/2016   History of Present Illness  Pt admitted for complaints of dizziness, chest pain, N/V, and L side weakness. 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 Pt with history including HTN, CVA, and polysubstance abuse. Of note, pt with subclavian steal syndrome, no intervention recommended at this time.  Clinical Impression  Pt is a pleasant 56 year old male who was admitted for dizziness and subsequent CVA. Pt performs bed mobility with mod I, transfers with min assist, and ambulation with min assist and RW. At baseline, pt typically uses SPC. Pt demonstrates deficits with balance/endurance/strength/safety/mobility. Pt is currently not at baseline level and needs hands on assist for guided mobility. Pt with fall during acute admission, fall mat replaced on floor. Pt remains at high risk for falls secondary to impairments. Decreased sensation noted to B LE. Would benefit from skilled PT to address above deficits and promote optimal return to PLOF; recommend transition to STR upon discharge from acute hospitalization. Pt currently refusing SNF at this time.       Follow Up Recommendations SNF    Equipment Recommendations  Rolling walker with 5" wheels    Recommendations for Other Services       Precautions / Restrictions Precautions Precautions: Fall Restrictions Weight Bearing Restrictions: No      Mobility  Bed Mobility Overal bed mobility: Modified Independent             General bed mobility comments: needs assist for bed railing while exiting bed on L side. Once seated, upright posture noted.  Transfers Overall transfer level: Needs assistance Equipment used: Rolling walker (2 wheeled) Transfers: Sit to/from Stand Sit to Stand: Min assist         General transfer comment: needs assist for anterior weight shift, once standing, demonstrates post  leaning, correctable with cues. Very unsteady with B support on RW  Ambulation/Gait Ambulation/Gait assistance: Min assist Ambulation Distance (Feet): 3 Feet Assistive device: Rolling walker (2 wheeled) Gait Pattern/deviations: Step-to pattern     General Gait Details: Small step to gait pattern performed with RW. Post leaning noted with min assist for correction, has difficulty with turns. Needs cues to reach back for recliner prior to sitting down   Stairs            Wheelchair Mobility    Modified Rankin (Stroke Patients Only)       Balance Overall balance assessment: History of Falls;Needs assistance Sitting-balance support: Feet supported;No upper extremity supported Sitting balance-Leahy Scale: Fair     Standing balance support: Bilateral upper extremity supported Standing balance-Leahy Scale: Poor Standing balance comment: post lean                             Pertinent Vitals/Pain Pain Assessment: No/denies pain    Home Living Family/patient expects to be discharged to:: Shelter/Homeless                      Prior Function Level of Independence: Independent with assistive device(s)         Comments: uses SPC at baseline in L hand     Hand Dominance        Extremity/Trunk Assessment   Upper Extremity Assessment Upper Extremity Assessment: Defer to OT evaluation (OT in room prior to evaluation)    Lower Extremity Assessment Lower Extremity Assessment: Generalized weakness (R  LE grossly 3/5; L LE grossly 4/5)       Communication   Communication: No difficulties  Cognition Arousal/Alertness: Awake/alert Behavior During Therapy: WFL for tasks assessed/performed Overall Cognitive Status: Within Functional Limits for tasks assessed                                        General Comments General comments (skin integrity, edema, etc.): decreased sensation to B LEs, baseline    Exercises Other  Exercises Other Exercises: Pt performed gait training in room with RW with min/mod assist (for turns). Pt with R foot drag, difficulty taking step, heavy reliance on RW. Pt with unsteady gait, needs cues to slow down for improved balance. During turns, pt tends to lose balance towards R side secondary to poor righting reactions. Able to perform 940' with RW total   Assessment/Plan    PT Assessment Patient needs continued PT services  PT Problem List Decreased strength;Decreased activity tolerance;Decreased balance;Decreased mobility;Decreased safety awareness;Impaired sensation       PT Treatment Interventions DME instruction;Gait training;Therapeutic activities;Therapeutic exercise;Balance training    PT Goals (Current goals can be found in the Care Plan section)  Acute Rehab PT Goals Patient Stated Goal: to leave the hospital today PT Goal Formulation: With patient Time For Goal Achievement: 05/01/16 Potential to Achieve Goals: Fair    Frequency 7X/week   Barriers to discharge Decreased caregiver support      Co-evaluation               End of Session Equipment Utilized During Treatment: Gait belt Activity Tolerance: Patient tolerated treatment well Patient left: in bed;with bed alarm set Nurse Communication: Mobility status PT Visit Diagnosis: Unsteadiness on feet (R26.81);Repeated falls (R29.6);Muscle weakness (generalized) (M62.81);Hemiplegia and hemiparesis Hemiplegia - Right/Left: Right Hemiplegia - dominant/non-dominant: Dominant Hemiplegia - caused by: Cerebral infarction    Time: [id number] PT Time Calculation (min) (ACUTE ONLY): 21 min   Charges:   PT Evaluation $PT Eval Low Complexity: 1 Procedure PT Treatments $Gait Training: 8-22 mins   PT G Codes:       Elizabeth PalauStephanie Jef Futch, PT, DPT [phone]   Maximilliano Kersh 04/17/2016, 3:56 PM

## 2016-04-17 NOTE — Progress Notes (Signed)
Patient refusing rehab. Walker delivered to room - set up by [employer] CM. Patient states he has money to get a taxi. Will proceed with discharge.

## 2016-04-17 NOTE — Care Management (Addendum)
Physical therapy reports that it would be best if patient would agree to snf and if he does not, would benefit from a rolling walker.  Spoke with patient's daughter Steven [name] and she was not able to convince patient to go to  skilled facility.  Patient tells this CM very firmly he wants to go home, will return to his hotel, does not intend to fill his medications, does not intend to follow up with a physician and does not want home health services.  Will provide patient with a walker.  Patient says that he has money for a cab to take him home.  CSW will l have a voucher as standby in the event patient has not been truthful about having money for a cab.  Discussed with patient that he would not have any services when he leaves and he says it is ok.  Informed Steven [name] that patient had declined all help.  She verbalizes understanding and says that patient is making the choice to return to the life he has- it is his choice.

## 2016-04-17 NOTE — Evaluation (Signed)
Occupational Therapy Evaluation Patient Details Name: Steven [name] MRN: [id number] DOB: Dec 23, 1960 Today's Date: 04/17/2016    History of Present Illness [facility]. [facility] with a Left MCA, and Medullary Infarct. Pt. PMHx include: HTN, CVA, Polysubstance Abuse, and Subclavian Steal Syndrome.   Clinical Impression   Pt. is a 56 y.o. Male who [facility] with a Left MCA, and Medullary Infarct. Pt. presents with UE weakness, pain, impaired balance, decreased activity tolerance, and limited functional mobility which hinders his ability to complete ADL, and IADL functioning. Pt. is at risk for Aspiration Pneumonia, and has excessive coughing which produces 6/10 pain. Pt. Could benefit from skilled OT services for ADL training, UE there. 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 Pt. Could benefit from SNF with follow-up OT services upon discharge.     Follow Up Recommendations  SNF    Equipment Recommendations       Recommendations for Other Services       Precautions / Restrictions Precautions Precautions: Fall Restrictions Weight Bearing Restrictions: No         Balance Overall balance assessment: History of Falls;Needs assistance Sitting-balance support: Feet supported;No upper extremity supported Sitting balance-Leahy Scale: Fair     Standing balance support: Bilateral upper extremity supported Standing balance-Leahy Scale: Poor Standing balance comment: post lean                           ADL either performed or assessed with clinical judgement   ADL Overall ADL's : Needs assistance/impaired Eating/Feeding: Set up   Grooming: Set up;Min guard               Lower Body Dressing: Minimal assistance               Functional mobility during ADLs: Minimal assistance       Vision Patient Visual Report: Other (comment);No change from baseline (Reports visual changes following first stroke are  better. Vision TBA as appropriate during functional tasks.)       Perception     Praxis      Pertinent Vitals/Pain Pain Assessment: 0-10 Pain Score: 6  Pain Location: When coughing Pain Intervention(s): Monitored during session     Hand Dominance     Extremity/Trunk Assessment Upper Extremity Assessment Upper Extremity Assessment: Generalized weakness   Lower Extremity Assessment Lower Extremity Assessment: Generalized weakness (R LE grossly 3/5; L LE grossly 4/5)       Communication Communication Communication: No difficulties   Cognition Arousal/Alertness: Awake/alert Behavior During Therapy: WFL for tasks assessed/performed Overall Cognitive Status: Within Functional Limits for tasks assessed                                     General Comments  decreased sensation to B LEs, baseline    Exercises   Shoulder Instructions      Home Living Family/patient expects to be discharged to:: Shelter/Homeless                                        Prior Functioning/Environment Level of Independence: Independent with assistive device(s)        Comments: Pt. reports independence with ADLs. Has been residing in a Hotel in the winter months, and has his meals at  [employer] across the street from the [place].        OT Problem List: Decreased strength;Decreased coordination;Decreased range of motion;Decreased activity tolerance;Decreased knowledge of precautions;Impaired balance (sitting and/or standing);Pain      OT Treatment/Interventions: Self-care/ADL training;Therapeutic exercise;Therapeutic activities;Energy conservation;Patient/family education;Manual therapy;DME and/or AE instruction;Neuromuscular education    OT Goals(Current goals can be found in the care plan section) Acute Rehab OT Goals Patient Stated Goal: to leave the hospital today  OT Frequency: Min 1X/week   Barriers to D/C:            Co-evaluation               End of Session    Activity Tolerance: Patient tolerated treatment well Patient left: in bed  OT Visit Diagnosis: Muscle weakness (generalized) (M62.81)                Time: 1405-1430 OT Time Calculation (min): 25 min Charges:  OT General Charges $OT Visit: 1 Procedure OT Evaluation $OT Eval Moderate Complexity: 1 Procedure G-Codes:     Olegario Messier, MS, OTR/L   Olegario Messier, MS, OTR/L 04/17/2016, 4:47 PM

## 2016-04-17 NOTE — Progress Notes (Signed)
*  PRELIMINARY RESULTS* Echocardiogram Echocardiogram Transesophageal has been performed.  Cristela BlueHege, Lazarus Sudbury 04/17/2016, 9:40 AM

## 2016-04-17 NOTE — Progress Notes (Signed)
[facility] Encounter Note  Patient: Steven [name] / Admit Date: 04/14/2016 / Date of Encounter: 04/17/2016, 8:36 AM   Subjective: Patient has some sob today but no evidence of chest pain   or other cardiovascular symptoms. Troponin is minimal which may be most consistent with demand ischemia rather than acute coronary syndrome. Patient has some wheezing today . No further stroke issues Rhae Hammock shows normal lv fx with normal valve structure and fx without thrombus ASD or PFO or source of stroke Aorta with mild athersclerosis  Review of Systems: Positive for: Shortness of breath and wheezing Negative for: Vision change, hearing change, syncope, dizziness, nausea, vomiting,diarrhea, bloody stool, stomach pain, cough, congestion, diaphoresis, urinary frequency, urinary pain,skin lesions, skin rashes Others previously listed  Objective: Telemetry: Normal sinus rhythm Physical Exam: Blood pressure 103/70, pulse 62, temperature 98.3 F (36.8 C), temperature source Oral, resp. rate 10, height 5\' 4"  (1.626 m), weight 58.5 kg (129 lb), SpO2 98 %. Body mass index is 22.14 kg/m. General: Well developed, well nourished, in no acute distress. Head: Normocephalic, atraumatic, sclera non-icteric, no xanthomas, nares are without discharge. Neck: No apparent masses Lungs: Normal respirations with diffuse wheezes, no rhonchi, no rales , basilar crackles   Heart: Regular rate and rhythm, normal S1 S2, no murmur, no rub, no gallop, PMI is normal size and placement, carotid upstroke normal without bruit, jugular venous pressure normal Abdomen: Soft, non-tender, non-distended with normoactive bowel sounds. No hepatosplenomegaly. Abdominal aorta is normal size without bruit Extremities: No edema, no clubbing, no cyanosis, no ulcers,  Peripheral: 2+ radial, 2+ femoral, 2+ dorsal pedal pulses Neuro: Alert and oriented. Moves all extremities spontaneously. Psych:  Responds to questions  appropriately with a normal affect.   Intake/Output Summary (Last 24 hours) at 04/17/16 0836 Last data filed at 04/17/16 0100  Gross per 24 hour  Intake              360 ml  Output             1350 ml  Net             -990 ml    Inpatient Medications:  . aspirin EC  81 mg Oral Daily  . atorvastatin  40 mg Oral q1800  . baclofen  10 mg Oral BID  . chlorhexidine  15 mL Mouth Rinse BID  . enoxaparin (LOVENOX) injection  40 mg Subcutaneous Q24H  . fentaNYL      . ipratropium-albuterol  3 mL Nebulization Once  . lidocaine      . lisinopril  20 mg Oral Daily  . mouth rinse  15 mL Mouth Rinse q12n4p  . metoprolol tartrate  12.5 mg Oral BID  . midazolam      . sodium chloride flush  3 mL Intravenous Q12H   Infusions:  . sodium chloride 20 mL/hr at 04/17/16 0159    Labs:  Recent Labs  04/14/16 1024 04/15/16 0420  NA 139 141  K 3.7 4.2  CL 103 111  CO2 25 22  GLUCOSE 156* 112*  BUN 24* 18  CREATININE 1.36* 0.95  CALCIUM 9.8 8.7*    Recent Labs  04/14/16 1024  AST 20  ALT 14*  ALKPHOS 96  BILITOT 1.0  PROT 8.1  ALBUMIN 4.5    Recent Labs  04/14/16 1024 04/15/16 0420  WBC 13.0* 12.9*  HGB 19.1* 15.4  HCT 54.5* 44.5  MCV 87.8 88.0  PLT 227 202    Recent Labs  04/14/16  1024 04/14/16 1151 04/14/16 1507 04/14/16 2103  CKTOTAL  --  140  --   --   TROPONINI 0.03*  --  0.04* 0.04*   Invalid input(s): POCBNP  Recent Labs  04/14/16 1151  HGBA1C 5.5     Weights: Filed Weights   04/16/16 0559 04/17/16 0443 04/17/16 0723  Weight: 63.5 kg (140 lb) 58.3 kg (128 lb 9.6 oz) 58.5 kg (129 lb)     Radiology/Studies:  Dg Eye Foreign Body  Result Date: 04/14/2016 CLINICAL DATA:  Screening for MRI. EXAM: ORBITS FOR FOREIGN BODY - 2 VIEW COMPARISON:  CT 04/14/2016 . FINDINGS: No radiopaque foreign bodies noted within the orbits. Patient cleared for MRI. Paranasal sinuses clear. No acute bony abnormality . IMPRESSION: No evidence of metallic foreign body  within the orbits. Electronically Signed   By: Maisie Fushomas  Register   On: 04/14/2016 12:27   Dg Chest 2 View  Result Date: 04/14/2016 CLINICAL DATA:  Cough. EXAM: CHEST  2 VIEW COMPARISON:  Radiograph of December 02, 2009. FINDINGS: The heart size and mediastinal contours are within normal limits. Both lungs are clear. No pneumothorax or pleural effusion is noted. The visualized skeletal structures are unremarkable. IMPRESSION: No active cardiopulmonary disease. Electronically Signed   By: Lupita RaiderJames  Green Jr, M.D.   On: 04/14/2016 10:14   Ct Head Wo Contrast  Result Date: 04/14/2016 CLINICAL DATA:  Dizziness EXAM: CT HEAD WITHOUT CONTRAST TECHNIQUE: Contiguous axial images were obtained from the base of the skull through the vertex without intravenous contrast. COMPARISON:  11/09/2012 FINDINGS: Brain: Mild atrophic changes are noted. Mild chronic white matter ischemic change is seen. Findings consistent with prior lacunar infarct are noted in the head of the caudate nucleus on the right as well as on the left and in the deep centrum semi ovale adjacent to the head of the caudate nucleus on the left. These changes are new from the prior exam. No acute infarct, acute hemorrhage or space-occupying mass lesion is noted. Vascular: No hyperdense vessel or unexpected calcification. Skull: Normal. Negative for fracture or focal lesion. Sinuses/Orbits: No acute finding. Other: None. IMPRESSION: Chronic atrophic and ischemic changes without acute abnormality. Electronically Signed   By: Alcide CleverMark  Lukens M.D.   On: 04/14/2016 10:23   Mr Brain Wo Contrast  Result Date: 04/14/2016 CLINICAL DATA:  56 year old male with 3 days of nausea vomiting and onset of dizziness. Left side numbness. Initial encounter. EXAM: MRI HEAD WITHOUT CONTRAST TECHNIQUE: Multiplanar, multiecho pulse sequences of the brain and surrounding structures were obtained without intravenous contrast. COMPARISON:  Head CT without contrast 1005 hours today. Brain  MRI 12/03/2009. FINDINGS: Brain: Patchy and small scattered areas of abnormal diffusion in the left corona radiata and surrounding subcortical white matter, including in the posterior left temporal lobe. The most confluent area is about 15 mm. ADC demonstrates a mix of restricted and isointense diffusion in these areas. There is associated T2 and FLAIR hyperintensity with no mass effect or hemorrhage. Scattered additional nonspecific cerebral white matter signal changes in both hemispheres. Superimposed multiple chronic lacunar infarcts in the bilateral deep gray matter nuclei and lower pons. There is also a small 6 mm focus of abnormal diffusion in the left lateral medulla (series 100 image 10) which appears mildly restricted. Associated T2 and FLAIR hyperintensity without mass effect or definite hemorrhage. Superimposed several chronic small lacunar infarcts in the bilateral cerebellar hemispheres. No cerebral cortical encephalomalacia and no definite chronic cerebral blood products. No midline shift, mass effect, evidence of mass lesion,  ventriculomegaly, extra-axial collection or acute intracranial hemorrhage. Cervicomedullary junction and pituitary are within normal limits. 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 Skull and upper cervical spine: Grossly negative. Sinuses/Orbits: Leftward gaze deviation. Otherwise negative orbits soft tissues. Visualized paranasal sinuses and mastoids are stable and well pneumatized. Other: Visible internal auditory structures appear normal. Negative scalp soft tissues. IMPRESSION: 1. Patchy acute to subacute white matter infarcts in the left MCA territory, and also a small acute to subacute infarct in the left PICA territory at the lateral medulla. No associated hemorrhage or mass effect. Suspect this combination reflects synchronous small vessel disease rather than sequelae of emboli. 2. Underlying  advanced for age chronic small vessel disease including multiple chronic lacunar infarcts in the bilateral basal ganglia, thalami, and brainstem. 3. Mild intracranial artery dolichoectasia. Electronically Signed   By: Odessa Fleming M.D.   On: 04/14/2016 13:19   US Carotid Bilateral  Result Date: 04/14/2016 CLINICAL DATA:  CVA. Slurred speech. History of hypertension and smoking. EXAM: BILATERAL CAROTID DUPLEX ULTRASOUND TECHNIQUE: Wallace Cullens scale imaging, color Doppler and duplex ultrasound were performed of bilateral carotid and vertebral arteries in the neck. COMPARISON:  12/03/2009 FINDINGS: Criteria: Quantification of carotid stenosis is based on velocity parameters that correlate the residual internal carotid diameter with NASCET-based stenosis levels, using the diameter of the distal internal carotid lumen as the denominator for stenosis measurement. The following velocity measurements were obtained: RIGHT ICA:  96/18 cm/sec CCA:  123/17 cm/sec SYSTOLIC ICA/CCA RATIO:  0.8 DIASTOLIC ICA/CCA RATIO:  1.1 ECA:  218 cm/sec LEFT ICA:  110/23 cm/sec CCA:  85/16 cm/sec SYSTOLIC ICA/CCA RATIO:  1.3 DIASTOLIC ICA/CCA RATIO:  1.5 ECA:  246 cm/sec RIGHT CAROTID ARTERY: There is a minimal amount of eccentric mixed echogenic partially shadowing plaque within the right carotid bulb (image 12 and 13). There is a minimal to moderate amount of eccentric mixed echogenic plaque involving the origin and proximal aspect of the right internal carotid artery (image 20), morphologically similar to the 11/2009 examination and again not resulting in elevated peak systolic velocities within the interrogated course the right internal carotid artery to suggest a hemodynamically significant stenosis. RIGHT VERTEBRAL ARTERY:  Antegrade Flow LEFT CAROTID ARTERY: There is a minimal amount of concentric intimal wall thickening / atherosclerotic plaque throughout the left common carotid artery (representative images 31 and 35). There is a moderate  amount of eccentric mixed echogenic partially shadowing plaque within the left carotid bulb (image 40), extending to involve the origin and proximal aspects of the left internal carotid artery (image 49), similar to the 11/2009 examination and again not resulting in elevated peak systolic velocities within the interrogated course the left internal carotid artery to suggest a hemodynamically significant stenosis. LEFT VERTEBRAL ARTERY: Retrograde flow previously antegrade flow was demonstrated within the left vertebral artery). Note is made of asymmetrically decreased blood pressure within the left upper extremity (right upper extremity blood pressure - 150/79; left upper extremity blood pressure - 117/79) with associated monophasic waveform within the left subclavian artery (image 65). IMPRESSION: 1. Minimal to moderate amount of bilateral atherosclerotic plaque, left greater than right, morphologically similar to the 11/2009 examination and again not resulting in a hemodynamically significant stenosis within either internal carotid artery. 2. Retrograde flow now demonstrated within the left vertebral artery with associated asymmetrically decreased blood pressure within the left upper extremity - constellation of findings worrisome for subclavian steal syndrome. Further evaluation CTA could performed as clinically indicated. Electronically  Signed   By: Simonne Come M.D.   On: 04/14/2016 19:54     Assessment and Recommendation  56 y.o. male with the current evidence of stroke possibly embolic in nature with known COPD tobacco abuse essential hypertension and previous cardiovascular disease with possible previous myocardial infarction currently improving. There is no current evidence of atrial fibrillation or rhythm disturbances or primary cardiac source of embolic stroke 1. Continue high intensity cholesterol therapy for further risk reduction cardiovascular event 2. Hypertension control with a goal systolic  blood pressure below 130 mm with ACE inhibitor 3. Continue monitoring for heart rate control and concerns of atrial fibrillation which could cause embolic phenomenon 4. Antiplatelet medication management  5. Consider implantable LINQ device for afib as source of embolic stroke Signed, Arnoldo Hooker M.D. FACC

## 2016-04-17 NOTE — Discharge Summary (Signed)
[facility] - [place] at [facility]   PATIENT NAME: Steven [name]    MR#:  [id number]  DATE OF BIRTH:  1960-05-31  DATE OF ADMISSION:  04/14/2016 ADMITTING PHYSICIAN: Milagros Loll, MD  DATE OF DISCHARGE: No discharge date for patient encounter.  PRIMARY CARE PHYSICIAN: No PCP Per Patient     ADMISSION DIAGNOSIS:  Dehydration [E86.0] Sinus tachycardia [R00.0] Renal insufficiency [N28.9] Elevated troponin [R74.8] Right leg weakness [R29.898] Left sided numbness [R20.0] Chest pain [R07.9] Chest pain, unspecified type [R07.9]  DISCHARGE DIAGNOSIS:  Principal Problem:   Cerebrovascular accident (CVA) (HCC) Active Problems:   CVA (cerebral vascular accident) (HCC)   Chest pain   At high risk for aspiration   Dysphagia   Esophageal spasm   Essential hypertension   Palliative care by specialist   Goals of care, counseling/discussion   Elevated troponin   Acute renal insufficiency   Leukocytosis   SECONDARY DIAGNOSIS:   Past Medical History:  Diagnosis Date  . CVA (cerebral vascular accident) (HCC)   . Hypertension     .pro HOSPITAL COURSE:  The patient is a 56 year old Caucasian male was admitted for history significant for history of medical noncompliance, tobacco, cocaine abuse, essential hypertension, COPD, who presents to the hospital with complaints of nausea, vomiting, midsternal chest pain. He also admitted of dizziness, which was going on for the past 5 days, dysphagia, coughing with any food. He was evaluated by speech therapist and recommended to be placed nothing by mouth due concerns of aspiration, however, refused, he requested food.   Brain MRI was performed and revealed patchy acute to subacute white matter infarcts in the left MCA territory, and also a small acute to subacute infarct in the left PICA territory at the lateral medulla. No associated hemorrhage or mass effect. Suspect this combination reflects synchronous  small vessel disease rather than sequelae of emboli. 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  The patient was seen by vascular surgery, no intervention was recommended. The patient was evaluated by neurologist, who recommended to advance baclofen due to esophageal spasm and choking on food. TEE was performed, revealing normal left ventricular function with normal valve structure and function, no thrombus, AST, PFO or other source of stroke, aorta was found to have mild atherosclerosis. The patient was evaluated by physical therapist and recommended skilled nursing facility, however, refused. He was felt to be stable to be discharged home. Discussion by problem: 1 Acute stroke with swallowing difficulties, ataxia, falls, patient was evaluated by speech therapist therapist recommended nothing by mouth, however, refused PEG  placement, palliative care discussed with patient , he decided on DO NOT RESUSCITATE, patient was recommended to continue dysphagia 3 diet with thickened liquids. His echocardiogram revealed septal wall motion abnormalities, ejection fraction of 50-55%, The patient was seen by cardiologist, recommended aspirin, TEE due to concerns of embolic phenomenon but this was unremarkable, no septal defect or PFO was noted, normal ejection fraction, normal cardiac function. The patient was advised to continue aspirin and statin . Dysphagia was felt to be related to esophageal spasm, in partial, by Dr. Thad Ranger,  baclofen was ordered and advanced with improvement of his condition. OT, PT saw patient in consultation and recommended skilled nursing facility placement, however, patient refused opting to go back home. Carotid ultrasound revealed minimal plaque formation, however, retrograde flow in the left vertebral artery, subclavian steal was of concern,  however, patient was seen  by vascular surgeon, no further interventions were recommended. #2. Dysphagia, patient was recommended to be nothing by mouth, since had aspiration with all consistencies. However, decided against PEG tube placement, now he is DO NOT RESUSCITATE, on dysphagia 3 diet with thickened liquids, aspiration precautions at all times . Patient's dysphagia was felt to be related to esophageal spasm, at least in part, by Dr. Thad Ranger, now on advanced baclofen dose, improved clinically, continue aspiration precautions, baclofen #3 . Acute renal insufficiency, resolved on IV fluids #4. Elevated troponin, was felt to be demand ischemia, continue aspirin, Lipitor, low-dose of beta blocker #5 hypertension, continue metoprolol,Prinivil, advance it as needed #6. 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 Statins     Other reaction(s): Other (See Comments) Weakness    DISCHARGE MEDICATIONS:   Current Discharge Medication List    START taking these medications   Details  aspirin EC 81 MG EC tablet Take 1 tablet (81 mg total) by mouth daily. Qty: 30 tablet, Refills: 3    baclofen (LIORESAL) 10 MG tablet Take 1 tablet (10 mg total) by mouth 2 (two) times daily. Qty: 30 each, Refills: 0    metoprolol tartrate (LOPRESSOR) 25 MG tablet Take 0.5 tablets (12.5 mg total) by mouth 2 (two) times daily. Qty: 30 tablet, Refills: 3      CONTINUE these medications which have NOT CHANGED   Details  lisinopril (PRINIVIL,ZESTRIL) 20 MG tablet Take 20 mg by mouth daily.         DISCHARGE  INSTRUCTIONS:    The patient is to follow-up with primary care physician within one week after discharge  If you experience worsening of your admission symptoms, develop shortness of breath, life threatening emergency, suicidal or homicidal thoughts you must seek medical attention immediately by calling 911 or calling your MD immediately  if symptoms less severe.  You Must read complete instructions/literature along with all the possible adverse reactions/side effects for all the Medicines you take and that have been prescribed to you. Take any new Medicines after you have completely understood and accept all the possible adverse reactions/side effects.   Please note  You were cared for by a hospitalist during your hospital stay. If you have any questions about your discharge medications or the care you received while you were in the hospital after you are discharged, you can call the unit and asked to speak with the hospitalist on call if the hospitalist that took care of you is not available. Once you are discharged, your primary care physician will handle any further medical issues. Please note that NO REFILLS for any discharge medications will be authorized once you are discharged, as it is imperative that you return to your primary care physician (or establish a relationship with a primary care physician if you do not have one) for your aftercare needs so that they can reassess your need for medications and monitor your lab values.    Today   CHIEF COMPLAINT:   Chief Complaint  Patient presents with  . Dizziness    HISTORY OF PRESENT ILLNESS:  Steven [name]  is a 56 y.o. male with a known history of medical noncompliance, tobacco, cocaine abuse, essential hypertension, COPD, who presents to the hospital with complaints of nausea, vomiting, midsternal chest pain. He also admitted of dizziness, which was going on for the past 5 days, dysphagia, coughing with any food. He was evaluated by  speech therapist and recommended to be placed nothing by mouth due concerns of aspiration, however, refused, he requested food.   Brain MRI was performed and revealed patchy acute to subacute white matter infarcts in the left MCA territory, and also a small acute to subacute infarct in the left PICA territory at the lateral medulla. No associated hemorrhage or mass effect. Suspect this combination reflects synchronous small vessel disease rather than sequelae of emboli. 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  The patient was seen by vascular surgery, no intervention was recommended. The patient was evaluated by neurologist, who recommended to advance baclofen due to esophageal spasm and choking on food. TEE was performed, revealing normal left ventricular function with normal valve structure and function, no thrombus, AST, PFO or other source of stroke, aorta was found to have mild atherosclerosis. The patient was evaluated by physical therapist and recommended skilled nursing facility, however, refused. He was felt to be stable to be discharged home. Discussion by problem: 1 Acute stroke with swallowing difficulties, ataxia, falls, patient was evaluated by speech therapist therapist recommended nothing by mouth, however, refused PEG  placement, palliative care discussed with patient , he decided on DO NOT RESUSCITATE, patient was recommended to continue dysphagia 3 diet with thickened liquids. His echocardiogram revealed septal wall motion abnormalities, ejection fraction of 50-55%, The patient was seen by cardiologist, recommended aspirin, TEE due to concerns of embolic phenomenon but this was unremarkable, no septal defect or PFO was noted, normal ejection fraction, normal cardiac function. The patient was advised to continue aspirin and statin . Dysphagia was felt to be related to  esophageal spasm, in partial, by Dr. Thad Ranger,  baclofen was ordered and advanced with improvement of his condition. OT, PT saw patient in consultation and recommended skilled nursing facility placement, however, patient refused opting to go back home. Carotid ultrasound revealed minimal plaque formation, however, retrograde flow in the left vertebral artery, subclavian steal was of concern, however, patient was seen by vascular surgeon, no further interventions were recommended. #2. Dysphagia, patient was recommended to be nothing by mouth, since had aspiration with all consistencies. However, decided against PEG tube placement, now he is DO NOT RESUSCITATE, on dysphagia 3 diet with thickened liquids, aspiration precautions at all times . Patient's dysphagia was felt to be related to esophageal spasm, at least in part, by Dr. Thad Ranger, now on advanced baclofen dose, improved clinically, continue aspiration precautions, baclofen #3 . Acute renal insufficiency, resolved on IV fluids #4. Elevated troponin, was felt to be demand ischemia, continue aspirin, Lipitor, low-dose of beta blocker #5 hypertension, continue metoprolol,Prinivil, advance it as needed #6. 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 rate 18, height 5\' 4"  (1.626 m), weight 58.5 kg (129 lb), SpO2 95 %.  I/O:  Intake/Output Summary (Last 24 hours) at 04/17/16 1740 Last data filed at 04/17/16 1543  Gross per 24 hour  Intake           394.67 ml  Output              450 ml  Net           -55.33 ml    PHYSICAL EXAMINATION:  GENERAL:  56 y.o.-year-old patient lying in  the bed with no acute distress.  EYES: Pupils equal, round, reactive to light and accommodation. No scleral icterus. Extraocular muscles intact.  HEENT: Head atraumatic, normocephalic. Oropharynx and nasopharynx clear.  NECK:  Supple, no jugular venous distention. No thyroid enlargement, no tenderness.  LUNGS: Normal breath sounds bilaterally, no wheezing, rales,rhonchi or crepitation. No use of accessory muscles of respiration.  CARDIOVASCULAR: S1, S2 normal. No murmurs, rubs, or gallops.  ABDOMEN: Soft, non-tender, non-distended. Bowel sounds present. No organomegaly or mass.  EXTREMITIES: No pedal edema, cyanosis, or clubbing.  NEUROLOGIC: Cranial nerves II through XII are intact. Muscle strength 5/5 in all extremities. Sensation intact. Gait not checked.  PSYCHIATRIC: The patient is alert and oriented x 3.  SKIN: No obvious rash, lesion, or ulcer.   DATA REVIEW:   CBC  Recent Labs Lab 04/15/16 0420  WBC 12.9*  HGB 15.4  HCT 44.5  PLT 202    Chemistries   Recent Labs Lab 04/14/16 1024 04/15/16 0420  NA 139 141  K 3.7 4.2  CL 103 111  CO2 25 22  GLUCOSE 156* 112*  BUN 24* 18  CREATININE 1.36* 0.95  CALCIUM 9.8 8.7*  AST 20  --   ALT 14*  --   ALKPHOS 96  --   BILITOT 1.0  --     Cardiac Enzymes  Recent Labs Lab 04/14/16 2103  TROPONINI 0.04*    Microbiology Results  Results for orders placed or performed during the hospital encounter of 04/14/16  Urine culture     Status: None   Collection Time: 04/15/16  8:05 AM  Result Value Ref Range Status   Specimen Description URINE, CLEAN CATCH  Final   Special Requests NONE  Final   Culture   Final    NO GROWTH Performed at [facility], [address] [place], [place] [address]    Report Status 04/17/2016 FINAL  Final    RADIOLOGY:  No results found.  EKG:   Orders placed or performed during the hospital encounter of 04/14/16  . ED EKG  . ED EKG  . EKG 12-Lead  . EKG 12-Lead  . EKG 12-Lead   . EKG 12-Lead      Management plans discussed with the patient, family and they are in agreement.  CODE STATUS:     Code Status Orders        Start     Ordered   04/15/16 1457  Do not attempt resuscitation (DNR)  Continuous    Question Answer Comment  In the event of cardiac or respiratory ARREST Do not call a "code blue"   In the event of cardiac or respiratory ARREST Do not perform Intubation, CPR, defibrillation or ACLS   In the event of cardiac or respiratory ARREST Use medication by any route, position, wound care, and other measures to relive pain and suffering. May use oxygen, suction and manual treatment of airway obstruction as needed for comfort.      04/15/16 1456    Code Status History  Date Active Date Inactive Code Status Order ID Comments User Context   04/14/2016 11:29 AM 04/15/2016  2:56 PM Full Code [id number]  Milagros Loll, MD ED      TOTAL TIME TAKING CARE OF THIS PATIENT: 40 minutes.    Katharina Caper M.D on 04/17/2016 at 5:40 PM  Between 7am to 6pm - Pager - [phone]  After 6pm go to [email or web address] - password [email or web address]  [facility]  Office  [phone]  CC: Primary care physician; No PCP Per Patient

## 2016-04-17 NOTE — Progress Notes (Signed)
Daily Progress Note   Patient Name: Steven [name]       Date: 04/17/2016 DOB: 1960/02/03  Age: 56 y.o. MRN#: [id number] Attending Physician: Katharina Caper, MD Primary Care Physician: No PCP Per Patient Admit Date: 04/14/2016  Reason for Consultation/Follow-up: Establishing goals of care  Subjective: Patient awake and alert. Finished majority of breakfast. No hiccups this morning. Able to cough up thick secretions.   Tells me he does not think he needs to go to rehab. Will just "go back to the place I was at."   Length of Stay: 1  Current Medications: Scheduled Meds:  . aspirin EC  81 mg Oral Daily  . atorvastatin  40 mg Oral q1800  . baclofen  10 mg Oral BID  . chlorhexidine  15 mL Mouth Rinse BID  . enoxaparin (LOVENOX) injection  40 mg Subcutaneous Q24H  . fentaNYL      . ipratropium-albuterol  3 mL Nebulization Once  . lidocaine      . lisinopril  20 mg Oral Daily  . mouth rinse  15 mL Mouth Rinse q12n4p  . metoprolol tartrate  12.5 mg Oral BID  . midazolam      . sodium chloride flush  3 mL Intravenous Q12H    Continuous Infusions: . sodium chloride 20 mL/hr at 04/17/16 0159    PRN Meds: acetaminophen **OR** acetaminophen, albuterol, ibuprofen, ondansetron **OR** ondansetron (ZOFRAN) IV, polyethylene glycol  Physical Exam  Constitutional: He is oriented to person, place, and time. He is cooperative.  HENT:  Head: Normocephalic and atraumatic.  Cardiovascular: Regular rhythm.   Pulmonary/Chest: He has decreased breath sounds.  Abdominal: Normal appearance.  Neurological: He is alert and oriented to person, place, and time.  Skin: Skin is warm and dry.  Psychiatric: He has a normal mood and affect. His behavior is normal. His speech is slurred.  Nursing note and  vitals reviewed.          Vital Signs: BP (!) 117/97 (BP Location: Left Arm)   Pulse 68   Temp 98.3 F (36.8 C) (Oral)   Resp (!) 9   Ht 5\' 4"  (1.626 m)   Wt 58.5 kg (129 lb)   SpO2 96%   BMI 22.14 kg/m  SpO2: SpO2: 96 % O2 Device: O2 Device: Not Delivered O2 Flow Rate: O2  Flow Rate (L/min): 3 L/min  Intake/output summary:   Intake/Output Summary (Last 24 hours) at 04/17/16 1038 Last data filed at 04/17/16 0100  Gross per 24 hour  Intake                0 ml  Output             1350 ml  Net            -1350 ml   LBM: Last BM Date: 04/11/16 Baseline Weight: Weight: 74.8 kg (165 lb) Most recent weight: Weight: 58.5 kg (129 lb)       Palliative Assessment/Data: PPS 40%   Flowsheet Rows     Most Recent Value  Intake Tab  Referral Department  Hospitalist  Unit at Time of Referral  Cardiac/Telemetry Unit  Palliative Care Primary Diagnosis  Neurology  Date Notified  04/15/16  Palliative Care Type  New Palliative care  Reason for referral  Clarify Goals of Care  Date of Admission  04/14/16  Date first seen by Palliative Care  04/15/16  # of days Palliative referral response time  0 Day(s)  # of days IP prior to Palliative referral  1  Clinical Assessment  Palliative Performance Scale Score  40%  Psychosocial & Spiritual Assessment  Palliative Care Outcomes  Patient/Family meeting held?  Yes  Who was at the meeting?  patient and daughter via telephone  Palliative Care Outcomes  Clarified goals of care, Provided psychosocial or spiritual support, ACP counseling assistance, Changed CPR status      Patient Active Problem List   Diagnosis Date Noted  . CVA (cerebral vascular accident) (HCC) 04/16/2016  . Cerebrovascular accident (CVA) (HCC)   . At high risk for aspiration   . Palliative care by specialist   . Goals of care, counseling/discussion   . Chest pain 04/14/2016    Palliative Care Assessment & Plan   Patient Profile: 56 y.o. male  with past medical  history of hypertension and CVA admitted on 04/14/2016 with dizziness, nausea/vomiting, and chest pain. Also with worsening left sided weakness. Neurology following. 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 Carotid dopplers negative for significant stenosis. Elevated troponin likely demand ischemia. Cardiology following. ECHO pending. Telemetry to monitor for afib. Patient has been evaluated by speech therapy and is very high risk for aspiration, therefore recommending NPO. Patient with history of noncompliance and substance abuse. Patient continuously asking to eat. Dysphagia 3 diet placed-patient and daughter understand risk for aspiration and decompensation.   Assessment: Acute stroke Dysphagia High risk for aspiration Hypertension Acute renal insufficiency Leukocytosis  Recommendations/Plan:  DNR/DNI  Continue diet. Patient and daughter understand risk for aspiration.   I do not feel he is appropriated for a hospice facility. Discussed with Dr. Winona LegatoVaickute. If patient agreeable to short term rehab, may benefit from palliative to follow outpatient.   [facility] at [facility] over the weekend but will f/u Tuesday if still hospitalized.   Code Status: DNR   Code Status Orders        Start     Ordered   04/15/16 1457  Do not attempt resuscitation (DNR)  Continuous    Question Answer Comment  In the event of cardiac or respiratory ARREST Do not call a "code blue"   In the event of cardiac or respiratory ARREST Do not perform Intubation, CPR, defibrillation or ACLS   In the event of cardiac or respiratory ARREST Use medication by any route,  position, wound care, and other measures to relive pain and suffering. May use oxygen, suction and manual treatment of airway obstruction as needed for comfort.      04/15/16 1456    Code Status History    Date Active Date Inactive Code Status Order ID Comments User Context   04/14/2016 11:29 AM 04/15/2016  2:56 PM  Full Code [id number]  Milagros Loll, MD ED       Prognosis:   Unable to determine  Discharge Planning:  To Be Determined  Care plan was discussed with patient, RN, RN CM, and Dr. Winona Legato  Thank you for allowing the Palliative Medicine Team to assist in the care of this patient.   Time In: 1015 Time Out: 1040 Total Time Prolonged Time Billed no      Greater than 50%  of this time was spent counseling and coordinating care related to the above assessment and plan.  Vennie Homans, FNP-C Palliative Medicine Team  Phone: [phone] Fax: [phone]  Please contact Palliative Medicine Team phone at [phone] for questions and concerns.

## 2016-04-17 NOTE — Progress Notes (Signed)
Patient given discharge teaching and paperwork regarding medications, diet, follow-up appointments and activity. Patient understanding verbalized. No complaints at this time. IV and telemetry discontinued prior to leaving. Skin assessment as previously charted and vitals are stable; on room air. Patient being discharged to self-care. No further needs by Care Management. Prescriptions sent to pharmacy. Taxi called for patient - he showed RN his money to pay for the cab. Walker given to patient.

## 2016-04-17 NOTE — Clinical Social Work Note (Signed)
CSW received referral for SNF.  CSW spoke to patient to discuss the benefits of going to SNF, and he still does not feel like he needs it.  Patient is alert and oriented x4 and able to make his own decisions, patient says he will get a cab to get home, CSW left a cab voucher with bedside nurse in case patient is not able to afford a cab.  Case discussed with case manager and plan is to discharge home.  CSW to sign off please re-consult if social work needs arise.  Ervin KnackEric R. Brennah Quraishi, MSW, [employer]WA [phone]

## 2016-04-17 NOTE — Progress Notes (Signed)
Chaplain visited patient while rounding. Patient expressed appreciation of Chaplain's visit, but he did not need chaplain's service.

## 2016-04-20 ENCOUNTER — Encounter: Payer: Self-pay | Admitting: Internal Medicine

## 2016-04-21 ENCOUNTER — Emergency Department: Payer: Medicaid Other

## 2016-04-21 ENCOUNTER — Encounter: Payer: Self-pay | Admitting: Emergency Medicine

## 2016-04-21 ENCOUNTER — Emergency Department
Admission: EM | Admit: 2016-04-21 | Discharge: 2016-04-22 | Disposition: A | Discharge status: Home / Self Care | Payer: Medicaid Other | Attending: Emergency Medicine | Admitting: Emergency Medicine

## 2016-04-21 DIAGNOSIS — Z79899 Other long term (current) drug therapy: Secondary | ICD-10-CM | POA: Insufficient documentation

## 2016-04-21 DIAGNOSIS — Z7982 Long term (current) use of aspirin: Secondary | ICD-10-CM | POA: Insufficient documentation

## 2016-04-21 DIAGNOSIS — S43035A Inferior dislocation of left humerus, initial encounter: Secondary | ICD-10-CM | POA: Insufficient documentation

## 2016-04-21 DIAGNOSIS — W19XXXA Unspecified fall, initial encounter: Secondary | ICD-10-CM

## 2016-04-21 DIAGNOSIS — S4992XA Unspecified injury of left shoulder and upper arm, initial encounter: Secondary | ICD-10-CM | POA: Diagnosis present

## 2016-04-21 DIAGNOSIS — Y998 Other external cause status: Secondary | ICD-10-CM | POA: Diagnosis not present

## 2016-04-21 DIAGNOSIS — F17210 Nicotine dependence, cigarettes, uncomplicated: Secondary | ICD-10-CM | POA: Diagnosis not present

## 2016-04-21 DIAGNOSIS — W010XXA Fall on same level from slipping, tripping and stumbling without subsequent striking against object, initial encounter: Secondary | ICD-10-CM | POA: Diagnosis not present

## 2016-04-21 DIAGNOSIS — Y9301 Activity, walking, marching and hiking: Secondary | ICD-10-CM | POA: Diagnosis not present

## 2016-04-21 DIAGNOSIS — Y92480 Sidewalk as the place of occurrence of the external cause: Secondary | ICD-10-CM | POA: Diagnosis not present

## 2016-04-21 DIAGNOSIS — S43005A Unspecified dislocation of left shoulder joint, initial encounter: Secondary | ICD-10-CM

## 2016-04-21 DIAGNOSIS — G9389 Other specified disorders of brain: Secondary | ICD-10-CM | POA: Diagnosis not present

## 2016-04-21 DIAGNOSIS — I10 Essential (primary) hypertension: Secondary | ICD-10-CM | POA: Insufficient documentation

## 2016-04-21 DIAGNOSIS — R52 Pain, unspecified: Secondary | ICD-10-CM

## 2016-04-21 LAB — BASIC METABOLIC PANEL
ANION GAP: 7 (ref 5–15)
BUN: 12 mg/dL (ref 6–20)
CHLORIDE: 108 mmol/L (ref 101–111)
CO2: 23 mmol/L (ref 22–32)
Calcium: 8.6 mg/dL — ABNORMAL LOW (ref 8.9–10.3)
Creatinine, Ser: 0.82 mg/dL (ref 0.61–1.24)
GFR calc Af Amer: >60 mL/min (ref >60)
Glucose, Bld: 115 mg/dL — ABNORMAL HIGH (ref 65–99)
POTASSIUM: 4 mmol/L (ref 3.5–5.1)
SODIUM: 138 mmol/L (ref 135–145)

## 2016-04-21 LAB — URINE DRUG SCREEN, QUALITATIVE (ARMC ONLY)
Amphetamines, Ur Screen: NOT DETECTED
Barbiturates, Ur Screen: NOT DETECTED
Benzodiazepine, Ur Scrn: NOT DETECTED
CANNABINOID 50 NG, UR ~~LOC~~: NOT DETECTED
COCAINE METABOLITE, UR ~~LOC~~: POSITIVE — AB
MDMA (ECSTASY) UR SCREEN: NOT DETECTED
Methadone Scn, Ur: NOT DETECTED
Opiate, Ur Screen: POSITIVE — AB
Phencyclidine (PCP) Ur S: NOT DETECTED
TRICYCLIC, UR SCREEN: NOT DETECTED

## 2016-04-21 LAB — CBC
HCT: 44.9 % (ref 40.0–52.0)
HEMOGLOBIN: 15.2 g/dL (ref 13.0–18.0)
MCH: 30.2 pg (ref 26.0–34.0)
MCHC: 33.9 g/dL (ref 32.0–36.0)
MCV: 89.2 fL (ref 80.0–100.0)
PLATELETS: 223 10*3/uL (ref 150–440)
RBC: 5.04 MIL/uL (ref 4.40–5.90)
RDW: 13.8 % (ref 11.5–14.5)
WBC: 13.4 10*3/uL — AB (ref 3.8–10.6)

## 2016-04-21 LAB — TROPONIN I: Troponin I: <0.03 ng/mL (ref <0.03)

## 2016-04-21 LAB — ETHANOL: Alcohol, Ethyl (B): <5 mg/dL (ref <5)

## 2016-04-21 MED ORDER — ASPIRIN 81 MG PO TBEC
81.0000 mg | DELAYED_RELEASE_TABLET | Freq: Every day | ORAL | Status: DC
  Administered 2016-04-21 – 2016-04-22 (×2): 81 mg via ORAL
  Filled 2016-04-21 (×4): qty 1

## 2016-04-21 MED ORDER — MORPHINE SULFATE (PF) 4 MG/ML IV SOLN
INTRAVENOUS | Status: AC
  Administered 2016-04-21: 4 mg via INTRAVENOUS
  Filled 2016-04-21: qty 1

## 2016-04-21 MED ORDER — ACETAMINOPHEN 325 MG PO TABS: 650.0000 mg | ORAL_TABLET | Freq: Four times a day (QID) | ORAL | Status: DC | PRN

## 2016-04-21 MED ORDER — BACLOFEN 10 MG PO TABS
10.0000 mg | ORAL_TABLET | Freq: Two times a day (BID) | ORAL | Status: DC
  Administered 2016-04-21 – 2016-04-22 (×3): 10 mg via ORAL
  Filled 2016-04-21 (×5): qty 1

## 2016-04-21 MED ORDER — LISINOPRIL 10 MG PO TABS
20.0000 mg | ORAL_TABLET | Freq: Every day | ORAL | Status: DC
  Administered 2016-04-21 – 2016-04-22 (×2): 20 mg via ORAL
  Filled 2016-04-21 (×3): qty 2

## 2016-04-21 MED ORDER — METOPROLOL TARTRATE 25 MG PO TABS
12.5000 mg | ORAL_TABLET | Freq: Two times a day (BID) | ORAL | Status: DC
  Administered 2016-04-21 – 2016-04-22 (×3): 12.5 mg via ORAL
  Filled 2016-04-21 (×3): qty 1

## 2016-04-21 MED ORDER — LIDOCAINE HCL (PF) 1 % IJ SOLN
5.0000 mL | Freq: Once | INTRAMUSCULAR | Status: AC
  Administered 2016-04-21: 5 mL
  Filled 2016-04-21: qty 15

## 2016-04-21 MED ORDER — MORPHINE SULFATE (PF) 4 MG/ML IV SOLN
4.0000 mg | Freq: Once | INTRAVENOUS | Status: AC
  Administered 2016-04-21: 4 mg via INTRAVENOUS

## 2016-04-21 NOTE — ED Notes (Signed)
Social worker in with pt now.  Pt alert.  nsr on  Monitor.

## 2016-04-21 NOTE — ED Notes (Signed)
Report off to stephen rn 

## 2016-04-21 NOTE — ED Notes (Signed)
Patient became strangled after taking pills with water.  States he usually takes his meds with applesauce.  Patient given applesauce and tolerated well.

## 2016-04-21 NOTE — ED Triage Notes (Signed)
Pt c/o left arm pain after mechanical fall. Appears to have deformity to upper humerus.

## 2016-04-21 NOTE — ED Notes (Signed)
Eating dinner   ua to lab.

## 2016-04-21 NOTE — ED Notes (Signed)
Left shoulder relocation done by dr Don Perkingveronese with local block. Sling applied to left arm.

## 2016-04-21 NOTE — Clinical Social Work Note (Addendum)
CSW received consult that patient had a fall in the community and was just recently discharged from the hospital last week.  Patient was offered SNF placement last week, however he declined SNF placement.  Patient is in agreement to going to SNF now for short term rehab and considering possible transition to long term care.  CSW to begin bed search process for SNF placement.  Ervin KnackEric R. Hassan Rowannterhaus, MSW, [employer]LCSWA [phone]  04/21/2016 3:32 PM

## 2016-04-21 NOTE — ED Notes (Signed)
Resumed care from valerie rn.  Pt alert.  nsr on monitor.  Iv in place.  Pt alert and talking.  States he is hungry.  Sling on left shoulder in place.

## 2016-04-21 NOTE — ED Provider Notes (Signed)
[facility] Emergency Department Provider Note  ____________________________________________  Time seen: Approximately 11:55 AM  I have reviewed the triage vital signs and the nursing notes.   HISTORY  Chief Complaint Fall   HPI Steven [name] is a 56 y.o. male h/o recent L sided CVA discharged 3 days ago, homelessness, cocaine and tobacco abuse, medication non compliance who presents status post mechanical fall. According to EMS patient was walking outside with his walker when he tripped on the sidewalk and fell onto his shoulder. Patient is complaining of severe pain in his left shoulder, that is worse with movement, constant, nonradiating, since the fall. Patient does not remember if he hit his head. He denies loss of consciousness. He denies headache, neck pain, back pain, chest pain, shortness breath, palpitations, abdominal pain both preceding or after the fall. Patient reports that he hasn't taken his medications today. Patient is slurring his speech which per him has been his speech since last stroke. He denies alcohol or drugs.  Past Medical History:  Diagnosis Date  . CVA (cerebral vascular accident) (HCC)   . Hypertension     Patient Active Problem List   Diagnosis Date Noted  . Dysphagia 04/17/2016  . Esophageal spasm 04/17/2016  . Acute renal insufficiency 04/17/2016  . Essential hypertension 04/17/2016  . Leukocytosis 04/17/2016  . Elevated troponin   . CVA (cerebral vascular accident) (HCC) 04/16/2016  . Cerebrovascular accident (CVA) (HCC)   . At high risk for aspiration   . Palliative care by specialist   . Goals of care, counseling/discussion   . Chest pain 04/14/2016    Past Surgical History:  Procedure Laterality Date  . TEE WITHOUT CARDIOVERSION N/A 04/17/2016   Procedure: TRANSESOPHAGEAL ECHOCARDIOGRAM (TEE);  Surgeon: Lamar Blinks, MD;  Location: [facility] ORS;  Service: Cardiovascular;  Laterality: N/A;    Prior to  Admission medications   Medication Sig Start Date End Date Taking? Authorizing Provider  aspirin EC 81 MG EC tablet Take 1 tablet (81 mg total) by mouth daily. 04/18/16  Yes Katharina Caper, MD  baclofen (LIORESAL) 10 MG tablet Take 1 tablet (10 mg total) by mouth 2 (two) times daily. 04/17/16  Yes Katharina Caper, MD  lisinopril (PRINIVIL,ZESTRIL) 20 MG tablet Take 20 mg by mouth daily.   Yes Historical Provider, MD  metoprolol tartrate (LOPRESSOR) 25 MG tablet Take 0.5 tablets (12.5 mg total) by mouth 2 (two) times daily. 04/17/16  Yes Katharina Caper, MD    Allergies Statins  Family History  Problem Relation Age of Onset  . Hypertension Father     Social History Social History  Substance Use Topics  . Smoking status: Current Every Day Smoker    Packs/day: 1.00    Types: Cigarettes  . Smokeless tobacco: Never Used  . Alcohol use No    Review of Systems Constitutional: Negative for fever. Eyes: Negative for visual changes. ENT: Negative for facial injury or neck injury Cardiovascular: Negative for chest injury. Respiratory: Negative for shortness of breath. Negative for chest wall injury. Gastrointestinal: Negative for abdominal pain or injury. Genitourinary: Negative for dysuria. Musculoskeletal: Negative for back injury, + R shoulder pain. Skin: Negative for laceration/abrasions. Neurological: Negative for head injury.  ____________________________________________   PHYSICAL EXAM:  VITAL SIGNS: ED Triage Vitals  Enc Vitals Group     BP 04/21/16 1149 (!) 202/94     Pulse Rate 04/21/16 1149 85     Resp 04/21/16 1149 18     Temp 04/21/16 1149 97.8  F (36.6 C)     Temp Source 04/21/16 1149 Oral     SpO2 04/21/16 1149 96 %     Weight 04/21/16 1147 130 lb (59 kg)     Height 04/21/16 1147 5\' 4"  (1.626 m)     Head Circumference --      Peak Flow --      Pain Score 04/21/16 1147 8     Pain Loc --      Pain Edu? --      Excl. in GC? --     Constitutional: Alert and  oriented to self and place. No acute distress. slurred speech. HEENT Head: Normocephalic and atraumatic. Face: No facial bony tenderness. Stable midface Ears: No hemotympanum bilaterally. No Battle sign Eyes: No eye injury. PERRL. No raccoon eyes Nose: Nontender. No epistaxis. No rhinorrhea Mouth/Throat: Mucous membranes are moist. No oropharyngeal blood. No dental injury. Airway patent without stridor. Normal voice. Neck: no C-collar in place. No midline c-spine tenderness.  Cardiovascular: Normal rate, regular rhythm. Normal and symmetric distal pulses are present in all extremities. Pulmonary/Chest: Chest wall is stable and nontender to palpation/compression. Normal respiratory effort. Breath sounds are normal. No crepitus.  Abdominal: Soft, nontender, non distended. Musculoskeletal: Obvious deformity of the L shoulder with step off, intact distal pulses and neuro exam. Nontender with normal full range of motion in all other extremities. No deformities. No thoracic or lumbar midline spinal tenderness. Pelvis is stable. Skin: Skin is warm, dry and intact. Multiple abrasions on upper extremities Psychiatric: Speech and behavior are appropriate. Neurological: Slurred speech. Moves all extremities to command. No gross focal neurologic deficits are appreciated.  Glascow Coma Score: 4 - Opens eyes on own 6 - Follows simple motor commands 4 - Seems confused, disoriented GCS: 14   ____________________________________________   LABS (all labs ordered are listed, but only abnormal results are displayed)  Labs Reviewed  CBC - Abnormal; Notable for the following:       Result Value   WBC 13.4 (*)    All other components within normal limits  BASIC METABOLIC PANEL - Abnormal; Notable for the following:    Glucose, Bld 115 (*)    Calcium 8.6 (*)    All other components within normal limits  URINE DRUG SCREEN, QUALITATIVE ([facility] ONLY) - Abnormal; Notable for the following:    Cocaine  Metabolite,Ur [place] POSITIVE (*)    Opiate, Ur Screen POSITIVE (*)    All other components within normal limits  ETHANOL  TROPONIN I   ____________________________________________  EKG  ED ECG REPORT I, Nita Sicklearolina Deren Degrazia, the attending physician, personally viewed and interpreted this ECG.  Normal sinus rhythm, frequent PVCs, rate of 92, incomplete LBBB, left axis deviation, less than 1mm elevation in anterior leads. Unchanged from prior.  ____________________________________________  RADIOLOGY  CT head and neck: negative  XR L shoulder:  Acute inferior and presumably anterior dislocation of the humeral head with respect to the bony glenoid. ____________________________________________   PROCEDURES  Procedure(s) performed:yes  Reduction of dislocation Date/Time: 04/21/2016 2:17 PM Performed by: Nita SickleVERONESE, [place] Authorized by: Nita SickleVERONESE, [place]  Consent: Verbal consent obtained. Risks and benefits: risks, benefits and alternatives were discussed Consent given by: patient Imaging studies: imaging studies available Patient identity confirmed: verbally with patient Time out: Immediately prior to procedure a "time out" was called to verify the correct patient, procedure, equipment, support staff and site/side marked as required. Preparation: Patient was prepped and draped in the usual sterile fashion. Local anesthesia used: yes Anesthesia: hematoma  block  Anesthesia: Local anesthesia used: yes Local Anesthetic: lidocaine 1% without epinephrine Anesthetic total: 10 mL  Sedation: Patient sedated: no Patient tolerance: Patient tolerated the procedure well with no immediate complications    Critical Care performed:  None ____________________________________________   INITIAL IMPRESSION / ASSESSMENT AND PLAN / ED COURSE   56 y.o. male h/o recent L sided CVA discharged 3 days ago, homelessness, cocaine and tobacco abuse, medication non compliance who presents status  post mechanical fall. Patient looks intoxicated and, his blood pressure is elevated at 202/94. Patient hasn't taken his medications for an unknown amount of time. The patient is complaining of left shoulder pain with a step-off deformity concerning for dislocation. Since the patient is homeless and looks intoxicated we'll do basic blood work, CT head, CT neck, x-ray of his shoulder. Clinical Course as of Apr 22 1914  Tue Apr 21, 2016  1418 Shoulder reduced per procedure note above. Patient has been off balance since his stroke and especially now unable to use his shoulder makes me concerned the patient is at increased risk for falling. Patient was supposed to be placed in rehabilitation on his prior admission after having a stroke and refused it. Patient is in agreement with rehabilitation placement at this time. Patient is homeless and cannot be discharged from the emergency room in this condition. His blood work, CT head and neck with no acute findings requiring admission. We'll restart patient on his home meds. We'll consult social work and PT for placement.  [CV]    Clinical Course User Index [CV] Nita Sickle, MD    Pertinent labs & imaging results that were available during my care of the patient were reviewed by me and considered in my medical decision making (see chart for details).    ____________________________________________   FINAL CLINICAL IMPRESSION(S) / ED DIAGNOSES  Final diagnoses:  Fall, initial encounter  Closed dislocation of left shoulder, initial encounter      NEW MEDICATIONS STARTED DURING THIS VISIT:  New Prescriptions   No medications on file     Note:  This document was prepared using Dragon voice recognition software and may include unintentional dictation errors.    Nita Sickle, MD 04/21/16 [phone]

## 2016-04-21 NOTE — ED Notes (Signed)
X-ray at bedside

## 2016-04-21 NOTE — ED Notes (Signed)
Pt urinary incontinence.  Pt changed with assistance.  Pt alert.

## 2016-04-21 NOTE — ED Notes (Signed)
Patient transported to CT 

## 2016-04-21 NOTE — [place] FL2 (Signed)
[place] MEDICAID FL2 LEVEL OF CARE SCREENING TOOL     IDENTIFICATION  Patient Name: Steven SkeetersBilly T Shugars Birthdate: [birth year] Sex: male Admission Date (Current Location): 04/21/2016  [place]  [place] [id number] [facility] Facility [facility] Address:  [facility], [address], [place], [place] [address]      Provider Number: [id number]  Attending Physician Name and Address:  Nita Sicklearolina Veronese, MD  Relative Name and Phone Number:  Patricia NettleStack,Kristina R Daughter [phone]     Current Level of Care: Hospital Recommended Level of Care: Skilled Nursing Facility Prior Approval Number:    Date Approved/Denied:   PASRR Number:    Discharge Plan: SNF    Current Diagnoses: Patient Active Problem List   Diagnosis Date Noted  . Dysphagia 04/17/2016  . Esophageal spasm 04/17/2016  . Acute renal insufficiency 04/17/2016  . Essential hypertension 04/17/2016  . Leukocytosis 04/17/2016  . Elevated troponin   . CVA (cerebral vascular accident) (HCC) 04/16/2016  . Cerebrovascular accident (CVA) (HCC)   . At high risk for aspiration   . Palliative care by specialist   . Goals of care, counseling/discussion   . Chest pain 04/14/2016    Orientation RESPIRATION BLADDER Height & Weight     Self, Time, Situation, Place      Weight: 130 lb (59 kg) Height:  5\' 4"  (162.6 cm)  BEHAVIORAL SYMPTOMS/MOOD NEUROLOGICAL BOWEL NUTRITION STATUS        Diet (Dysphagia 3 diet)  AMBULATORY STATUS COMMUNICATION OF NEEDS Skin     Verbally                         Personal Care Assistance Level of Assistance              Functional Limitations Info             SPECIAL CARE FACTORS FREQUENCY  PT (By licensed PT)     PT Frequency: 5x a week              Contractures Contractures Info: Not present    Additional Factors Info  Code Status, Allergies Code Status Info: Statins             Current Medications (04/21/2016):  This is the current  hospital active medication list Current Facility-Administered Medications  Medication Dose Route Frequency Provider Last Rate Last Dose  . acetaminophen (TYLENOL) tablet 650 mg  650 mg Oral Q6H PRN Nita Sicklearolina Veronese, MD      . aspirin EC tablet 81 mg  81 mg Oral Daily Nita Sicklearolina Veronese, MD   81 mg at 04/21/16 1427  . baclofen (LIORESAL) tablet 10 mg  10 mg Oral BID Nita Sicklearolina Veronese, MD   10 mg at 04/21/16 1430  . lisinopril (PRINIVIL,ZESTRIL) tablet 20 mg  20 mg Oral Daily Nita Sicklearolina Veronese, MD   20 mg at 04/21/16 1427  . metoprolol tartrate (LOPRESSOR) tablet 12.5 mg  12.5 mg Oral BID Nita Sicklearolina Veronese, MD   12.5 mg at 04/21/16 1427   Current Outpatient Prescriptions  Medication Sig Dispense Refill  . aspirin EC 81 MG EC tablet Take 1 tablet (81 mg total) by mouth daily. 30 tablet 3  . baclofen (LIORESAL) 10 MG tablet Take 1 tablet (10 mg total) by mouth 2 (two) times daily. 30 each 0  . lisinopril (PRINIVIL,ZESTRIL) 20 MG tablet Take 20 mg by mouth daily.    . metoprolol tartrate (LOPRESSOR) 25 MG tablet Take 0.5 tablets (12.5 mg total) by  mouth 2 (two) times daily. 30 tablet 3     Discharge Medications: Please see discharge summary for a list of discharge medications.  Relevant Imaging Results:  Relevant Lab Results:   Additional Information    Joselyne Spake, Ervin Knack, LCSWA

## 2016-04-21 NOTE — ED Notes (Signed)
Told patient he would need to be moved to room 11h.  Patient disgruntled but compliant.  This nurse attempted to make patient as comfortable as possible.  This nurse called patient relations to speak with patient.

## 2016-04-22 MED ORDER — LISINOPRIL 20 MG PO TABS
20.0000 mg | ORAL_TABLET | Freq: Every day | ORAL | 0 refills | Status: AC
Start: 2016-04-22 — End: ?

## 2016-04-22 MED ORDER — METOPROLOL TARTRATE 25 MG PO TABS: 12.5000 mg | ORAL_TABLET | Freq: Two times a day (BID) | ORAL | 0 refills | Status: AC

## 2016-04-22 MED ORDER — ASPIRIN 81 MG PO TBEC: 81.0000 mg | DELAYED_RELEASE_TABLET | Freq: Every day | ORAL | 3 refills | Status: AC

## 2016-04-22 MED ORDER — BACLOFEN 10 MG PO TABS: 10.0000 mg | ORAL_TABLET | Freq: Two times a day (BID) | ORAL | 0 refills | Status: AC

## 2016-04-22 NOTE — Clinical Social Work Note (Addendum)
CS[facility], [facility] looking for [facility] placement for patient.  [facility] agreed to accept patient for short term rehab.  CSW updated patient and his daughter [name] [phone] who is patient's payee, they have agreed to accept bed offer for patient.  CSW asked patient's daughter if she has looked at applying for Medicare for patient and she did not know if he would be eligible since he gets SSI.  CSW gave her contact information for [place] [facility] to follow up with them to see if they can assist with helping patient apply for Medicare.  CSW also informed SNF that patient and his daughter would like assistance with trying to apply for Medicare.  CSW left message for social worker at SNF that patient is not homeless per nurse's report, he just chooses to stay in hotels, and his daughter wires him money weekly so he can stay at different places.    CSW updated physician and bedside nurse that patient has a bed available at SNF for today.  Patient to [facility] once discharge orders have been received.  Patient and family agreeable to plans will transport via ems RN to call report to [phone].   Windell MouldingEric Johnnisha Forton, MSW, Theresia MajorsLCSWA [phone]

## 2016-04-22 NOTE — Clinical Social Work Note (Signed)
Clinical Social Work Assessment  Patient Details  Name: Steven [name] MRN: [id number] Date of Birth: [birth year]  Date of referral:  04/21/16               Reason for consult:  Facility Placement                Permission sought to share information with:  Facility Medical sales representativeContact Representative, Family Supports Permission granted to share information::  Yes, Verbal Permission Granted  Name::     Steven [name],Steven [name] Daughter [phone]   Agency::  SNF admissions  Relationship::     Contact Information:     Housing/Transportation Living arrangements for the past 2 months:  Hotel/Motel Source of Information:  Patient, Adult Children Patient Interpreter Needed:  None Criminal Activity/Legal Involvement Pertinent to Current Situation/Hospitalization:  No - Comment as needed Significant Relationships:  Adult Children Lives with:  Self Do you feel safe going back to the place where you live?  No Need for family participation in patient care:  Yes (Comment) (Patient is in agreement with having his daughter help with decision making.)  Care giving concerns:  Patient and his daughter feel he needs some rehab before he is able to return back to his hotel he stays at.   Social Worker assessment / plan:  Patient is a 56 year old male who lives in a hotels or extended stay, by choice.  Patient is alert and oriented x4 and able to express himself even though he has some dysphasia.  Patient had a stroke a few years ago, and since then has been on SSI and has Medicaid.  Patient gave CSW permission to speak with his daughter as well.  CSW informed patient and his daughter that patient will have limited choices due to only having Medicaid, patient and daughter understood.  CSW explained to them what to expect at SNF and what the process is for working on discharge planning.  Patient and his daughter did not express any other questions or concerns.  Employment status:  Disabled (Comment on whether or not  currently receiving Disability) Insurance information:  [place] Recommendations:  Skilled Nursing Facility Information / Referral to community resources:  Skilled Nursing Facility  Patient/Family's Response to care:  Patient and daughter agreeable to going to SNF for short term rehab.  Patient/Family's Understanding of and Emotional Response to Diagnosis, Current Treatment, and Prognosis: Patient expressed he would rather not go to SNF, but he understands he needs some therapy before he is able to return back home.  Emotional Assessment Appearance:  Appears older than stated age Attitude/Demeanor/Rapport:    Affect (typically observed):  Blunt, Appropriate, Calm, Stable Orientation:  Oriented to Self, Oriented to Place, Oriented to  Time, Oriented to Situation Alcohol / Substance use:  Illicit Drugs Psych involvement (Current and /or in the community):  No (Comment)  Discharge Needs  Concerns to be addressed:  Substance Abuse Concerns, Lack of Support Readmission within the last 30 days:  Yes (04/17/16 back to his hotel that he is staying at.) Current discharge risk:  Lives alone, Lack of support system, Substance Abuse Barriers to Discharge:  Continued Medical Work up   [place] [employer], Leah Skora [name], LCSWA 04/22/2016, 4:31 PM

## 2016-04-22 NOTE — ED Provider Notes (Addendum)
  Discussed with social work. [facility]. We'll provide new prescriptions for medications for continued outpatient management. UDS positive for cocaine and opiate. Counseled on substance abuse. Patient is medically stable.   Current Facility-Administered Medications:  .  acetaminophen (TYLENOL) tablet 650 mg, 650 mg, Oral, Q6H PRN, Nita Sicklearolina Veronese, MD .  aspirin EC tablet 81 mg, 81 mg, Oral, Daily, Nita Sicklearolina Veronese, MD, 81 mg at 04/22/16 1027 .  baclofen (LIORESAL) tablet 10 mg, 10 mg, Oral, BID, Nita Sicklearolina Veronese, MD, 10 mg at 04/22/16 1027 .  lisinopril (PRINIVIL,ZESTRIL) tablet 20 mg, 20 mg, Oral, Daily, Nita Sicklearolina Veronese, MD, 20 mg at 04/22/16 1027 .  metoprolol tartrate (LOPRESSOR) tablet 12.5 mg, 12.5 mg, Oral, BID, Nita Sicklearolina Veronese, MD, 12.5 mg at 04/22/16 1027  Current Outpatient Prescriptions:  .  aspirin 81 MG EC tablet, Take 1 tablet (81 mg total) by mouth daily., Disp: 30 tablet, Rfl: 3 .  baclofen (LIORESAL) 10 MG tablet, Take 1 tablet (10 mg total) by mouth 2 (two) times daily., Disp: 30 each, Rfl: 0 .  lisinopril (PRINIVIL,ZESTRIL) 20 MG tablet, Take 1 tablet (20 mg total) by mouth daily., Disp: 30 tablet, Rfl: 0 .  metoprolol tartrate (LOPRESSOR) 25 MG tablet, Take 0.5 tablets (12.5 mg total) by mouth 2 (two) times daily., Disp: 30 tablet, Rfl: 0  Vitals:   04/22/16 1300 04/22/16 1509  BP: (!) 163/74 (!) 159/75  Pulse: 66 75  Resp:  14  Temp:           Sharman CheekPhillip Yerania Chamorro, MD 04/22/16 1526    Sharman CheekPhillip Devonia Farro, MD 04/22/16 1534

## 2016-04-22 NOTE — ED Notes (Signed)
Pt currently resting in bed. NAD. Lights are dimmed and pt has blanket at this time.

## 2016-04-22 NOTE — Clinical Social Work Placement (Signed)
   CLINICAL SOCIAL WORK PLACEMENT  NOTE  Date:  04/22/2016  Patient Details  Name: Steven [name] MRN: [id number] Date of Birth: [birth year]  Clinical Social Work is seeking post-discharge placement for this patient at the Skilled  Nursing Facility level of care (*CSW will initial, date and re-position this form in  chart as items are completed):  Yes   Patient/family provided with [place] Clinical Social Work Department's list of facilities offering this level of care within the geographic area requested by the patient (or if unable, by the patient's family).  Yes   Patient/family informed of their freedom to choose among providers that offer the needed level of care, that participate in Medicare, Medicaid or managed care program needed by the patient, have an available bed and are willing to accept the patient.  Yes   Patient/family informed of [place]'s ownership interest in [facility] and [facility], as well as of the fact that they are under no obligation to receive care at these facilities.  PASRR submitted to EDS on 04/22/16     PASRR number received on       Existing PASRR number confirmed on 04/22/16     FL2 transmitted to all facilities in geographic area requested by pt/family on 04/22/16     FL2 transmitted to all facilities within larger geographic area on 04/22/16     Patient informed that his/her managed care company has contracts with or will negotiate with certain facilities, including the following:        Yes   Patient/family informed of bed offers received.  [facility]     Physician recommends and patient chooses bed at      [facility] 04/22/[facility].  Patient to be transferred to facility by       Patient family notified on 04/22/16 of transfer.  Name of family member notified:  [name] patient's daughter [phone]     PHYSICIAN Please  sign FL2     Additional Comment:    _______________________________________________ Darleene CleaverAnterhaus, Lakeeta Dobosz R, LCSWA 04/22/2016, 5:20 PM

## 2016-04-22 NOTE — ED Notes (Signed)
Attempted to call report. Marchelle Folksmanda answered and reports they did not know pt was coming to facility today. CSW on the phone attempting to figure out pt placement. CSW reported to RN to call back and ask for Peyton NajjarLarry, Interior and spatial designerdirector of nursing to give report.

## 2016-05-22 ENCOUNTER — Encounter (INDEPENDENT_AMBULATORY_CARE_PROVIDER_SITE_OTHER): Payer: Medicaid Other | Admitting: Vascular Surgery

## 2016-09-01 ENCOUNTER — Other Ambulatory Visit: Payer: Self-pay | Admitting: Physician Assistant

## 2016-09-01 DIAGNOSIS — R1110 Vomiting, unspecified: Secondary | ICD-10-CM

## 2016-09-01 DIAGNOSIS — I69991 Dysphagia following unspecified cerebrovascular disease: Secondary | ICD-10-CM

## 2016-09-03 ENCOUNTER — Ambulatory Visit
Admission: RE | Admit: 2016-09-03 | Discharge: 2016-09-03 | Disposition: A | Discharge status: Home / Self Care | Payer: Medicaid Other | Source: Ambulatory Visit | Attending: Physician Assistant | Admitting: Physician Assistant

## 2016-09-03 DIAGNOSIS — K219 Gastro-esophageal reflux disease without esophagitis: Secondary | ICD-10-CM | POA: Insufficient documentation

## 2016-09-03 DIAGNOSIS — K449 Diaphragmatic hernia without obstruction or gangrene: Secondary | ICD-10-CM | POA: Diagnosis not present

## 2016-09-03 DIAGNOSIS — I69991 Dysphagia following unspecified cerebrovascular disease: Secondary | ICD-10-CM | POA: Diagnosis not present

## 2016-09-03 DIAGNOSIS — R1110 Vomiting, unspecified: Secondary | ICD-10-CM | POA: Diagnosis not present

## 2017-02-21 ENCOUNTER — Emergency Department: Payer: Medicaid Other

## 2017-02-21 ENCOUNTER — Encounter: Payer: Self-pay | Admitting: Emergency Medicine

## 2017-02-21 ENCOUNTER — Other Ambulatory Visit: Payer: Self-pay

## 2017-02-21 ENCOUNTER — Emergency Department
Admission: EM | Admit: 2017-02-21 | Discharge: 2017-02-21 | Disposition: A | Discharge status: Home / Self Care | Payer: Medicaid Other | Attending: Emergency Medicine | Admitting: Emergency Medicine

## 2017-02-21 DIAGNOSIS — I10 Essential (primary) hypertension: Secondary | ICD-10-CM | POA: Insufficient documentation

## 2017-02-21 DIAGNOSIS — Z7982 Long term (current) use of aspirin: Secondary | ICD-10-CM | POA: Diagnosis not present

## 2017-02-21 DIAGNOSIS — R1012 Left upper quadrant pain: Secondary | ICD-10-CM | POA: Diagnosis not present

## 2017-02-21 DIAGNOSIS — F17210 Nicotine dependence, cigarettes, uncomplicated: Secondary | ICD-10-CM | POA: Diagnosis not present

## 2017-02-21 DIAGNOSIS — Z79899 Other long term (current) drug therapy: Secondary | ICD-10-CM | POA: Diagnosis not present

## 2017-02-21 DIAGNOSIS — R109 Unspecified abdominal pain: Secondary | ICD-10-CM

## 2017-02-21 LAB — COMPREHENSIVE METABOLIC PANEL
ALT: 44 U/L (ref 17–63)
AST: 32 U/L (ref 15–41)
Albumin: 4.3 g/dL (ref 3.5–5.0)
Alkaline Phosphatase: 86 U/L (ref 38–126)
Anion gap: 9 (ref 5–15)
BUN: 20 mg/dL (ref 6–20)
CHLORIDE: 102 mmol/L (ref 101–111)
CO2: 26 mmol/L (ref 22–32)
Calcium: 9.6 mg/dL (ref 8.9–10.3)
Creatinine, Ser: 1.18 mg/dL (ref 0.61–1.24)
Glucose, Bld: 102 mg/dL — ABNORMAL HIGH (ref 65–99)
POTASSIUM: 4.1 mmol/L (ref 3.5–5.1)
SODIUM: 137 mmol/L (ref 135–145)
Total Bilirubin: 0.6 mg/dL (ref 0.3–1.2)
Total Protein: 7.7 g/dL (ref 6.5–8.1)

## 2017-02-21 LAB — URINALYSIS, COMPLETE (UACMP) WITH MICROSCOPIC
BILIRUBIN URINE: NEGATIVE
Bacteria, UA: NONE SEEN
GLUCOSE, UA: NEGATIVE mg/dL
Ketones, ur: NEGATIVE mg/dL
LEUKOCYTES UA: NEGATIVE
NITRITE: NEGATIVE
PH: 5 (ref 5.0–8.0)
Protein, ur: NEGATIVE mg/dL
Specific Gravity, Urine: 1.024 (ref 1.005–1.030)
Squamous Epithelial / LPF: NONE SEEN

## 2017-02-21 LAB — CBC
HCT: 48.9 % (ref 40.0–52.0)
Hemoglobin: 16.2 g/dL (ref 13.0–18.0)
MCH: 29.2 pg (ref 26.0–34.0)
MCHC: 33.1 g/dL (ref 32.0–36.0)
MCV: 88 fL (ref 80.0–100.0)
PLATELETS: 300 10*3/uL (ref 150–440)
RBC: 5.56 MIL/uL (ref 4.40–5.90)
RDW: 13.9 % (ref 11.5–14.5)
WBC: 13 10*3/uL — ABNORMAL HIGH (ref 3.8–10.6)

## 2017-02-21 LAB — LIPASE, BLOOD: LIPASE: 31 U/L (ref 11–51)

## 2017-02-21 MED ORDER — KETOROLAC TROMETHAMINE 30 MG/ML IJ SOLN
30.0000 mg | Freq: Once | INTRAMUSCULAR | Status: AC
  Administered 2017-02-21: 30 mg via INTRAVENOUS
  Filled 2017-02-21: qty 1

## 2017-02-21 MED ORDER — NAPROXEN 500 MG PO TABS: 500.0000 mg | ORAL_TABLET | Freq: Two times a day (BID) | ORAL | 2 refills | Status: AC

## 2017-02-21 MED ORDER — LEVOFLOXACIN 500 MG PO TABS: 500.0000 mg | ORAL_TABLET | Freq: Every day | ORAL | 0 refills | Status: AC

## 2017-02-21 NOTE — ED Provider Notes (Signed)
[facility] Emergency Department Provider Note   ____________________________________________    I have reviewed the triage vital signs and the nursing notes.   HISTORY  Chief Complaint Flank Pain     HPI Steven [name] is a 57 y.o. male who presents with complaints of left flank pain.  Patient reports the pain started approximately 10 AM in his left flank and is moderate to severe and cramping in nature.  It does not radiate.  He reports he had a kidney stone 7 years ago which was similar to this but this is more severe.  He is not sure if he is having hematuria.  No dysuria.  No fevers, no nausea or vomiting.  No history of abdominal surgery.  He has not taken anything for this  Past Medical History:  Diagnosis Date  . CVA (cerebral vascular accident) (HCC)   . Hypertension     Patient Active Problem List   Diagnosis Date Noted  . Dysphagia 04/17/2016  . Esophageal spasm 04/17/2016  . Acute renal insufficiency 04/17/2016  . Essential hypertension 04/17/2016  . Leukocytosis 04/17/2016  . Elevated troponin   . CVA (cerebral vascular accident) (HCC) 04/16/2016  . Cerebrovascular accident (CVA) (HCC)   . At high risk for aspiration   . Palliative care by specialist   . Goals of care, counseling/discussion   . Chest pain 04/14/2016    Past Surgical History:  Procedure Laterality Date  . TEE WITHOUT CARDIOVERSION N/A 04/17/2016   Procedure: TRANSESOPHAGEAL ECHOCARDIOGRAM (TEE);  Surgeon: Lamar BlinksBruce J Kowalski, MD;  Location: [facility] ORS;  Service: Cardiovascular;  Laterality: N/A;    Prior to Admission medications   Medication Sig Start Date End Date Taking? Authorizing Provider  aspirin 81 MG EC tablet Take 1 tablet (81 mg total) by mouth daily. 04/22/16   Sharman CheekStafford, Phillip, MD  baclofen (LIORESAL) 10 MG tablet Take 1 tablet (10 mg total) by mouth 2 (two) times daily. 04/22/16   Sharman CheekStafford, Phillip, MD  levofloxacin (LEVAQUIN) 500 MG tablet Take 1  tablet (500 mg total) by mouth daily for 10 days. 02/21/17 03/03/17  Jene EveryKinner, Trentin Knappenberger, MD  lisinopril (PRINIVIL,ZESTRIL) 20 MG tablet Take 1 tablet (20 mg total) by mouth daily. 04/22/16   Sharman CheekStafford, Phillip, MD  metoprolol tartrate (LOPRESSOR) 25 MG tablet Take 0.5 tablets (12.5 mg total) by mouth 2 (two) times daily. 04/22/16   Sharman CheekStafford, Phillip, MD  naproxen (NAPROSYN) 500 MG tablet Take 1 tablet (500 mg total) by mouth 2 (two) times daily with a meal. 02/21/17   Jene EveryKinner, Lile Mccurley, MD     Allergies Statins  Family History  Problem Relation Age of Onset  . Hypertension Father     Social History Social History   Tobacco Use  . Smoking status: Current Every Day Smoker    Packs/day: 1.00    Types: Cigarettes  . Smokeless tobacco: Never Used  Substance Use Topics  . Alcohol use: No  . Drug use: Not on file    Review of Systems  Constitutional: No fever/chills Eyes: No visual changes.  ENT: No sore throat. Cardiovascular: Denies chest pain. Respiratory: Denies shortness of breath. Gastrointestinal: Flank pain as above Genitourinary: Negative for dysuria. Musculoskeletal: Negative for back pain. Skin: Negative for rash. Neurological: History of CVAs   ____________________________________________   PHYSICAL EXAM:  VITAL SIGNS: ED Triage Vitals  Enc Vitals Group     BP 02/21/17 1622 140/80     Pulse Rate 02/21/17 1622 76     Resp  02/21/17 1622 20     Temp 02/21/17 1622 98.1 F (36.7 C)     Temp Source 02/21/17 1622 Oral     SpO2 02/21/17 1622 96 %     Weight --      Height 02/21/17 1622 1.626 m (5\' 4" )     Head Circumference --      Peak Flow --      Pain Score 02/21/17 1628 8     Pain Loc --      Pain Edu? --      Excl. in GC? --     Constitutional: Alert and oriented. No acute distress. Pleasant and interactive Eyes: Conjunctivae are normal.   Nose: No congestion/rhinnorhea. Mouth/Throat: Mucous membranes are moist.    Cardiovascular: Normal rate, regular  rhythm. Good peripheral circulation. Respiratory: Normal respiratory effort.  No retractions. Gastrointestinal: Soft and nontender. No distention.  No CVA tenderness. Genitourinary: deferred Musculoskeletal:   Warm and well perfused Neurologic:  Normal speech and language. Skin:  Skin is warm, dry and intact. No rash noted. Psychiatric: Mood and affect are normal. Speech and behavior are normal.  ____________________________________________   LABS (all labs ordered are listed, but only abnormal results are displayed)  Labs Reviewed  CBC - Abnormal; Notable for the following components:      Result Value   WBC 13.0 (*)    All other components within normal limits  COMPREHENSIVE METABOLIC PANEL - Abnormal; Notable for the following components:   Glucose, Bld 102 (*)    All other components within normal limits  URINALYSIS, COMPLETE (UACMP) WITH MICROSCOPIC - Abnormal; Notable for the following components:   Color, Urine YELLOW (*)    APPearance CLOUDY (*)    Hgb urine dipstick SMALL (*)    All other components within normal limits  LIPASE, BLOOD   ____________________________________________  EKG  None ____________________________________________  RADIOLOGY  CT renal stone study ____________________________________________   PROCEDURES  Procedure(s) performed: No  Procedures   Critical Care performed: No ____________________________________________   INITIAL IMPRESSION / ASSESSMENT AND PLAN / ED COURSE  Pertinent labs & imaging results that were available during my care of the patient were reviewed by me and considered in my medical decision making (see chart for details).  Patient presents with abrupt onset left flank pain, history of kidney stones.  Certainly high on the differential is renal colic however differential also includes pancreatitis, diverticulitis, UTI.  Pending labs urinalysis CT, will treat with Toradol  Patient had significant improvement  after Toradol.  CT scan demonstrates no kidney stone although possibly some faint stranding around the left kidney, urinalysis is not consistent with UTI however I will treat with Cipro and send culture.  I have asked the patient follow-up closely with PCP and return if symptoms worsen    ____________________________________________   FINAL CLINICAL IMPRESSION(S) / ED DIAGNOSES  Final diagnoses:  Left flank pain        Note:  This document was prepared using Dragon voice recognition software and may include unintentional dictation errors.    Jene Every, MD 02/21/17 1919

## 2017-02-21 NOTE — ED Triage Notes (Signed)
Pt c/o left flank pain, hx/o kidney stone 7 years ago. Pt denies N/V. States that his pain started this morning around 1030. Pt in NAD at this time.

## 2017-02-21 NOTE — ED Notes (Signed)
Esign not working, pt verbalized discharge instructions and has no questions at this time 

## 2017-03-03 ENCOUNTER — Encounter: Payer: Self-pay | Admitting: Student

## 2017-03-04 ENCOUNTER — Encounter: Payer: Self-pay | Admitting: Certified Registered Nurse Anesthetist

## 2017-03-04 ENCOUNTER — Ambulatory Visit: Payer: Medicaid Other | Admitting: Anesthesiology

## 2017-03-04 ENCOUNTER — Ambulatory Visit
Admission: RE | Admit: 2017-03-04 | Discharge: 2017-03-04 | Disposition: A | Discharge status: Home / Self Care | Payer: Medicaid Other | Source: Ambulatory Visit | Attending: Gastroenterology | Admitting: Gastroenterology

## 2017-03-04 ENCOUNTER — Encounter
Admission: RE | Disposition: A | Discharge status: Home / Self Care | Payer: Self-pay | Source: Ambulatory Visit | Attending: Gastroenterology

## 2017-03-04 DIAGNOSIS — I69398 Other sequelae of cerebral infarction: Secondary | ICD-10-CM | POA: Diagnosis not present

## 2017-03-04 DIAGNOSIS — K2950 Unspecified chronic gastritis without bleeding: Secondary | ICD-10-CM | POA: Diagnosis not present

## 2017-03-04 DIAGNOSIS — K219 Gastro-esophageal reflux disease without esophagitis: Secondary | ICD-10-CM | POA: Diagnosis not present

## 2017-03-04 DIAGNOSIS — Z888 Allergy status to other drugs, medicaments and biological substances status: Secondary | ICD-10-CM | POA: Diagnosis not present

## 2017-03-04 DIAGNOSIS — K3189 Other diseases of stomach and duodenum: Secondary | ICD-10-CM | POA: Diagnosis not present

## 2017-03-04 DIAGNOSIS — K449 Diaphragmatic hernia without obstruction or gangrene: Secondary | ICD-10-CM | POA: Diagnosis not present

## 2017-03-04 DIAGNOSIS — R1310 Dysphagia, unspecified: Secondary | ICD-10-CM | POA: Insufficient documentation

## 2017-03-04 DIAGNOSIS — Z79899 Other long term (current) drug therapy: Secondary | ICD-10-CM | POA: Insufficient documentation

## 2017-03-04 DIAGNOSIS — Z7982 Long term (current) use of aspirin: Secondary | ICD-10-CM | POA: Diagnosis not present

## 2017-03-04 DIAGNOSIS — I69328 Other speech and language deficits following cerebral infarction: Secondary | ICD-10-CM | POA: Insufficient documentation

## 2017-03-04 DIAGNOSIS — K210 Gastro-esophageal reflux disease with esophagitis: Secondary | ICD-10-CM | POA: Insufficient documentation

## 2017-03-04 DIAGNOSIS — R1110 Vomiting, unspecified: Secondary | ICD-10-CM | POA: Diagnosis present

## 2017-03-04 DIAGNOSIS — I10 Essential (primary) hypertension: Secondary | ICD-10-CM | POA: Diagnosis not present

## 2017-03-04 DIAGNOSIS — F17200 Nicotine dependence, unspecified, uncomplicated: Secondary | ICD-10-CM | POA: Diagnosis not present

## 2017-03-04 HISTORY — PX: ESOPHAGOGASTRODUODENOSCOPY (EGD) WITH PROPOFOL: SHX5813

## 2017-03-04 HISTORY — DX: Other psychoactive substance abuse, uncomplicated: F19.10

## 2017-03-04 LAB — URINE DRUG SCREEN, QUALITATIVE (ARMC ONLY)
AMPHETAMINES, UR SCREEN: NOT DETECTED
Barbiturates, Ur Screen: NOT DETECTED
Benzodiazepine, Ur Scrn: NOT DETECTED
Cannabinoid 50 Ng, Ur ~~LOC~~: NOT DETECTED
Cocaine Metabolite,Ur ~~LOC~~: NOT DETECTED
MDMA (Ecstasy)Ur Screen: NOT DETECTED
METHADONE SCREEN, URINE: NOT DETECTED
Opiate, Ur Screen: NOT DETECTED
Phencyclidine (PCP) Ur S: NOT DETECTED
Tricyclic, Ur Screen: NOT DETECTED

## 2017-03-04 SURGERY — ESOPHAGOGASTRODUODENOSCOPY (EGD) WITH PROPOFOL
Anesthesia: General

## 2017-03-04 MED ORDER — LIDOCAINE HCL (CARDIAC) 20 MG/ML IV SOLN
INTRAVENOUS | Status: DC | PRN
  Administered 2017-03-04: 10 mg via INTRAVENOUS

## 2017-03-04 MED ORDER — PHENYLEPHRINE HCL 10 MG/ML IJ SOLN
INTRAMUSCULAR | Status: DC | PRN
  Administered 2017-03-04 (×3): 200 ug via INTRAVENOUS

## 2017-03-04 MED ORDER — MIDAZOLAM HCL 2 MG/2ML IJ SOLN
INTRAMUSCULAR | Status: AC
  Filled 2017-03-04: qty 2

## 2017-03-04 MED ORDER — LIDOCAINE HCL (PF) 2 % IJ SOLN
INTRAMUSCULAR | Status: AC
  Filled 2017-03-04: qty 10

## 2017-03-04 MED ORDER — LIDOCAINE HCL (PF) 1 % IJ SOLN
INTRAMUSCULAR | Status: AC
  Administered 2017-03-04: 0.3 mL
  Filled 2017-03-04: qty 2

## 2017-03-04 MED ORDER — PROPOFOL 10 MG/ML IV BOLUS
INTRAVENOUS | Status: DC | PRN
  Administered 2017-03-04: 100 mg via INTRAVENOUS

## 2017-03-04 MED ORDER — FENTANYL CITRATE (PF) 100 MCG/2ML IJ SOLN
INTRAMUSCULAR | Status: DC | PRN
  Administered 2017-03-04: 50 ug via INTRAVENOUS

## 2017-03-04 MED ORDER — EPHEDRINE SULFATE 50 MG/ML IJ SOLN
INTRAMUSCULAR | Status: DC | PRN
  Administered 2017-03-04: 15 mg via INTRAVENOUS

## 2017-03-04 MED ORDER — FENTANYL CITRATE (PF) 100 MCG/2ML IJ SOLN
INTRAMUSCULAR | Status: AC
  Filled 2017-03-04: qty 2

## 2017-03-04 MED ORDER — SODIUM CHLORIDE 0.9 % IV SOLN
INTRAVENOUS | Status: DC
  Administered 2017-03-04: 1000 mL via INTRAVENOUS

## 2017-03-04 MED ORDER — PROPOFOL 500 MG/50ML IV EMUL
INTRAVENOUS | Status: AC
  Filled 2017-03-04: qty 50

## 2017-03-04 MED ORDER — SODIUM CHLORIDE 0.9 % IV SOLN: INTRAVENOUS | Status: DC

## 2017-03-04 MED ORDER — MIDAZOLAM HCL 2 MG/2ML IJ SOLN
INTRAMUSCULAR | Status: DC | PRN
  Administered 2017-03-04: 1 mg via INTRAVENOUS

## 2017-03-04 NOTE — Transfer of Care (Signed)
Immediate Anesthesia Transfer of Care Note  Patient: Steven [name]  Procedure(s) Performed: ESOPHAGOGASTRODUODENOSCOPY (EGD) WITH PROPOFOL (N/A )  Patient Location: PACU and Endoscopy Unit  Anesthesia Type:General  Level of Consciousness: awake, alert  and patient cooperative  Airway & Oxygen Therapy: Patient Spontanous Breathing and Patient connected to nasal cannula oxygen  Post-op Assessment: Report given to RN, Post -op Vital signs reviewed and unstable, Anesthesiologist notified and hypotension treated with phenylephrine. Dr. Henrene HawkingKephart to monitor.  Post vital signs: Reviewed  Last Vitals:  Vitals:   03/04/17 1151  BP: (!) 155/72  Pulse: 70  Resp: 16  Temp: (!) 36 C  SpO2: 96%    Last Pain:  Vitals:   03/04/17 1151  TempSrc: Tympanic  PainSc: 0-No pain         Complications: No apparent anesthesia complications

## 2017-03-04 NOTE — Anesthesia Post-op Follow-up Note (Signed)
Anesthesia QCDR form completed.        

## 2017-03-04 NOTE — Anesthesia Preprocedure Evaluation (Addendum)
Anesthesia Evaluation  Patient identified by MRN, date of birth, ID band Patient awake    Reviewed: Allergy & Precautions, NPO status , Patient's Chart, lab work & pertinent test results, reviewed documented beta blocker date and time   History of Anesthesia Complications Negative for: history of anesthetic complications  Airway Mallampati: III       Dental  (+) Edentulous Upper, Edentulous Lower   Pulmonary Current Smoker,           Cardiovascular hypertension, Pt. on medications and Pt. on home beta blockers (-) Past MI and (-) CHF (-) dysrhythmias (-) Valvular Problems/Murmurs     Neuro/Psych neg Seizures CVA (B weakness, speech difficulties), Residual Symptoms    GI/Hepatic Neg liver ROS, GERD  Medicated and Poorly Controlled,  Endo/Other  neg diabetes  Renal/GU Renal InsufficiencyRenal disease     Musculoskeletal   Abdominal   Peds  Hematology   Anesthesia Other Findings   Reproductive/Obstetrics                            Anesthesia Physical Anesthesia Plan  ASA: III  Anesthesia Plan: General   Post-op Pain Management:    Induction: Intravenous  PONV Risk Score and Plan: 1 and TIVA and Propofol infusion  Airway Management Planned: Nasal Cannula  Additional Equipment:   Intra-op Plan:   Post-operative Plan:   Informed Consent: I have reviewed the patients History and Physical, chart, labs and discussed the procedure including the risks, benefits and alternatives for the proposed anesthesia with the patient or authorized representative who has indicated his/her understanding and acceptance.     Plan Discussed with:   Anesthesia Plan Comments:         Anesthesia Quick Evaluation

## 2017-03-04 NOTE — H&P (Signed)
Outpatient short stay form Pre-procedure 03/04/2017 12:31 PM Christena DeemMartin U Kristin Lamagna MD  Primary Physician: Leward Quaneresa Sifferman PA  Reason for visit: EGD  History of present illness: Patient is a 57 year old male with a history of dysphagia.  He had a CVA about a year ago.  He has developed speech difficulties since that time.  He is also currently has some dysphagia.  He had a barium swallow that indicated no evidence of stricture or stenosis and the barium tablet went down without difficulty.  He does have a more so a recurrent issue of the emesis.  He does not regurgitate foods.  He does take 81 mg aspirin daily.  He denies use of other other aspirin products or blood thinning agents.    Current Facility-Administered Medications:  .  0.9 %  sodium chloride infusion, , Intravenous, Continuous, Christena DeemSkulskie, Zohaib Heeney U, MD, Last Rate: 20 mL/hr at 03/04/17 1214, 1,000 mL at 03/04/17 1214 .  0.9 %  sodium chloride infusion, , Intravenous, Continuous, Christena DeemSkulskie, Rhoda Waldvogel U, MD  Medications Prior to Admission  Medication Sig Dispense Refill Last Dose  . pantoprazole (PROTONIX) 40 MG tablet Take 40 mg by mouth daily.   03/04/2017 at 0700  . polyethylene glycol (MIRALAX / GLYCOLAX) packet Take 17 g by mouth daily.     Marland Kitchen. aspirin 81 MG EC tablet Take 1 tablet (81 mg total) by mouth daily. 30 tablet 3 03/03/2017  . baclofen (LIORESAL) 10 MG tablet Take 1 tablet (10 mg total) by mouth 2 (two) times daily. 30 each 0 03/04/2017 at 0700  . lisinopril (PRINIVIL,ZESTRIL) 20 MG tablet Take 1 tablet (20 mg total) by mouth daily. 30 tablet 0 03/04/2017 at 0700  . metoprolol tartrate (LOPRESSOR) 25 MG tablet Take 0.5 tablets (12.5 mg total) by mouth 2 (two) times daily. 30 tablet 0 03/04/2017 at 0700  . naproxen (NAPROSYN) 500 MG tablet Take 1 tablet (500 mg total) by mouth 2 (two) times daily with a meal. 20 tablet 2 03/04/2017 at 0700     Allergies  Allergen Reactions  . Statins     Other reaction(s): Other (See  Comments) Weakness  . Feldene [Piroxicam] Rash     Past Medical History:  Diagnosis Date  . CVA (cerebral vascular accident) (HCC)   . Hypertension   . Substance abuse (HCC)     Review of systems:      Physical Exam    Heart and lungs: Regular rate and rhythm without rub or gallop, lungs are bilaterally clear.    HEENT: Normocephalic atraumatic eyes are anicteric    Other:    Pertinant exam for procedure: Soft nontender nondistended bowel sounds positive normoactive.  Protuberant mildly.    Planned proceedures: EGD and indicated procedures. I have discussed the risks benefits and complications of procedures to include not limited to bleeding, infection, perforation and the risk of sedation and the patient wishes to proceed.    Christena DeemMartin U Wesleigh Markovic, MD Gastroenterology 03/04/2017  12:31 PM

## 2017-03-04 NOTE — Anesthesia Postprocedure Evaluation (Signed)
Anesthesia Post Note  Patient: Steven [name]  Procedure(s) Performed: ESOPHAGOGASTRODUODENOSCOPY (EGD) WITH PROPOFOL (N/A )  Patient location during evaluation: Endoscopy Anesthesia Type: General Level of consciousness: awake and alert Pain management: pain level controlled Vital Signs Assessment: post-procedure vital signs reviewed and stable Respiratory status: spontaneous breathing and respiratory function stable Cardiovascular status: stable Anesthetic complications: no     Last Vitals:  Vitals:   03/04/17 1151 03/04/17 1455  BP: (!) 155/72 (!) 82/64  Pulse: 70 63  Resp: 16 12  Temp: (!) 36 C (!) 36.4 C  SpO2: 96% 97%    Last Pain:  Vitals:   03/04/17 1455  TempSrc: Tympanic  PainSc:                  KEPHART,WILLIAM K

## 2017-03-04 NOTE — Op Note (Signed)
[facility] Gastroenterology Patient Name: Steven [name] Procedure Date: 03/04/2017 2:30 PM MRN: [id number] Account #: [id number] Date of Birth: 1960/12/31 Admit Type: Outpatient Age: 57 Room: [facility] ENDO ROOM 1 Gender: Male Note Status: Finalized Procedure:            Upper GI endoscopy Indications:          Vomiting Providers:            Christena Deem, MD Referring MD:         No Local Md, MD (Referring MD) Medicines:            Monitored Anesthesia Care Complications:        No immediate complications. Procedure:            Pre-Anesthesia Assessment:                       - ASA Grade Assessment: III - A patient with severe                        systemic disease.                       After obtaining informed consent, the endoscope was                        passed under direct vision. Throughout the procedure,                        the patient's blood pressure, pulse, and oxygen                        saturations were monitored continuously. The Endoscope                        was introduced through the mouth, and advanced to the                        third part of duodenum. The upper GI endoscopy was                        accomplished without difficulty. The patient tolerated                        the procedure well. Findings:      LA Grade B (one or more mucosal breaks greater than 5 mm, not extending       between the tops of two mucosal folds) esophagitis with no bleeding was       found. Biopsies were taken with a cold forceps for histology.      A small hiatal hernia was found. The Z-line was a variable distance from       incisors; the hiatal hernia was sliding.      Patchy minimal inflammation characterized by erythema was found in the       gastric body. Biopsies were taken with a cold forceps for histology.       Biopsies were taken with a cold forceps for Helicobacter pylori testing.      Patchy mild inflammation characterized by erythema  was found in the       duodenal bulb.      The exam of the duodenum was otherwise normal.  Impression:           - LA Grade B erosive esophagitis. Biopsied.                       - Small hiatal hernia. Recommendation:       - Advance diet as tolerated.                       - Use Protonix (pantoprazole) 40 mg PO BID for 1 month.                       - Use Protonix (pantoprazole) 40 mg PO daily daily.                       - Return to GI office in 1 month.                       - Await pathology results. Procedure Code(s):    --- Professional ---                       [phone], Esophagogastroduodenoscopy, flexible, transoral;                        with biopsy, single or multiple Diagnosis Code(s):    --- Professional ---                       K20.8, Other esophagitis                       K44.9, Diaphragmatic hernia without obstruction or                        gangrene                       R11.10, Vomiting, unspecified CPT copyright 2016 [employer]. All rights reserved. The codes documented in this report are preliminary and upon coder review may  be revised to meet current compliance requirements. Christena DeemMartin U Skulskie, MD 03/04/2017 2:55:30 PM This report has been signed electronically. Number of Addenda: 0 Note Initiated On: 03/04/2017 2:30 PM      [facility]

## 2017-03-08 LAB — SURGICAL PATHOLOGY

## 2017-11-19 ENCOUNTER — Encounter: Payer: Self-pay | Admitting: Emergency Medicine

## 2017-11-19 ENCOUNTER — Other Ambulatory Visit: Payer: Self-pay

## 2017-11-19 ENCOUNTER — Emergency Department: Payer: Medicaid Other

## 2017-11-19 ENCOUNTER — Inpatient Hospital Stay
Admission: EM | Admit: 2017-11-19 | Discharge: 2017-11-19 | DRG: 378 | Disposition: A | Discharge status: Other Acute Inpatient Hospital | Payer: Medicaid Other | Attending: Internal Medicine | Admitting: Internal Medicine

## 2017-11-19 DIAGNOSIS — R6510 Systemic inflammatory response syndrome (SIRS) of non-infectious origin without acute organ dysfunction: Secondary | ICD-10-CM | POA: Diagnosis present

## 2017-11-19 DIAGNOSIS — Z9049 Acquired absence of other specified parts of digestive tract: Secondary | ICD-10-CM | POA: Diagnosis not present

## 2017-11-19 DIAGNOSIS — Z79899 Other long term (current) drug therapy: Secondary | ICD-10-CM | POA: Diagnosis not present

## 2017-11-19 DIAGNOSIS — I10 Essential (primary) hypertension: Secondary | ICD-10-CM | POA: Diagnosis present

## 2017-11-19 DIAGNOSIS — I959 Hypotension, unspecified: Secondary | ICD-10-CM | POA: Diagnosis present

## 2017-11-19 DIAGNOSIS — N179 Acute kidney failure, unspecified: Secondary | ICD-10-CM | POA: Diagnosis present

## 2017-11-19 DIAGNOSIS — Z888 Allergy status to other drugs, medicaments and biological substances status: Secondary | ICD-10-CM | POA: Diagnosis not present

## 2017-11-19 DIAGNOSIS — I69391 Dysphagia following cerebral infarction: Secondary | ICD-10-CM | POA: Diagnosis not present

## 2017-11-19 DIAGNOSIS — K920 Hematemesis: Principal | ICD-10-CM | POA: Diagnosis present

## 2017-11-19 DIAGNOSIS — Z791 Long term (current) use of non-steroidal anti-inflammatories (NSAID): Secondary | ICD-10-CM | POA: Diagnosis not present

## 2017-11-19 DIAGNOSIS — Z7982 Long term (current) use of aspirin: Secondary | ICD-10-CM | POA: Diagnosis not present

## 2017-11-19 DIAGNOSIS — K2270 Barrett's esophagus without dysplasia: Secondary | ICD-10-CM | POA: Diagnosis present

## 2017-11-19 DIAGNOSIS — K449 Diaphragmatic hernia without obstruction or gangrene: Secondary | ICD-10-CM | POA: Diagnosis present

## 2017-11-19 DIAGNOSIS — Z8249 Family history of ischemic heart disease and other diseases of the circulatory system: Secondary | ICD-10-CM | POA: Diagnosis not present

## 2017-11-19 DIAGNOSIS — R1310 Dysphagia, unspecified: Secondary | ICD-10-CM | POA: Diagnosis present

## 2017-11-19 DIAGNOSIS — J449 Chronic obstructive pulmonary disease, unspecified: Secondary | ICD-10-CM | POA: Diagnosis present

## 2017-11-19 DIAGNOSIS — F17210 Nicotine dependence, cigarettes, uncomplicated: Secondary | ICD-10-CM | POA: Diagnosis present

## 2017-11-19 DIAGNOSIS — R579 Shock, unspecified: Secondary | ICD-10-CM

## 2017-11-19 DIAGNOSIS — J9600 Acute respiratory failure, unspecified whether with hypoxia or hypercapnia: Secondary | ICD-10-CM

## 2017-11-19 DIAGNOSIS — K922 Gastrointestinal hemorrhage, unspecified: Secondary | ICD-10-CM | POA: Diagnosis present

## 2017-11-19 HISTORY — DX: Chronic obstructive pulmonary disease, unspecified: J44.9

## 2017-11-19 LAB — URINALYSIS, COMPLETE (UACMP) WITH MICROSCOPIC
Bacteria, UA: NONE SEEN
Bilirubin Urine: NEGATIVE
Glucose, UA: 50 mg/dL — AB
Hgb urine dipstick: NEGATIVE
KETONES UR: 5 mg/dL — AB
Leukocytes, UA: NEGATIVE
NITRITE: NEGATIVE
PH: 6 (ref 5.0–8.0)
PROTEIN: 100 mg/dL — AB
Specific Gravity, Urine: 1.027 (ref 1.005–1.030)
Squamous Epithelial / LPF: NONE SEEN (ref 0–5)

## 2017-11-19 LAB — PREPARE RBC (CROSSMATCH)

## 2017-11-19 LAB — COMPREHENSIVE METABOLIC PANEL
ALBUMIN: 3.7 g/dL (ref 3.5–5.0)
ALT: 29 U/L (ref 0–44)
ANION GAP: 13 (ref 5–15)
AST: 26 U/L (ref 15–41)
Alkaline Phosphatase: 69 U/L (ref 38–126)
BILIRUBIN TOTAL: 0.6 mg/dL (ref 0.3–1.2)
BUN: 47 mg/dL — AB (ref 6–20)
CO2: 26 mmol/L (ref 22–32)
Calcium: 8.4 mg/dL — ABNORMAL LOW (ref 8.9–10.3)
Chloride: 102 mmol/L (ref 98–111)
Creatinine, Ser: 2.03 mg/dL — ABNORMAL HIGH (ref 0.61–1.24)
GFR calc Af Amer: 40 mL/min — ABNORMAL LOW (ref >60)
GFR calc non Af Amer: 35 mL/min — ABNORMAL LOW (ref >60)
GLUCOSE: 229 mg/dL — AB (ref 70–99)
Potassium: 3.6 mmol/L (ref 3.5–5.1)
Sodium: 141 mmol/L (ref 135–145)
TOTAL PROTEIN: 6.7 g/dL (ref 6.5–8.1)

## 2017-11-19 LAB — PROTIME-INR
INR: 1.04
Prothrombin Time: 13.5 seconds (ref 11.4–15.2)

## 2017-11-19 LAB — CREATININE, URINE, RANDOM: CREATININE, URINE: 124 mg/dL

## 2017-11-19 LAB — PHOSPHORUS: Phosphorus: 3.3 mg/dL (ref 2.5–4.6)

## 2017-11-19 LAB — CBC WITH DIFFERENTIAL/PLATELET
Abs Immature Granulocytes: 0.49 10*3/uL — ABNORMAL HIGH (ref 0.00–0.07)
Basophils Absolute: 0.1 10*3/uL (ref 0.0–0.1)
Basophils Relative: 0 %
EOS ABS: 0 10*3/uL (ref 0.0–0.5)
Eosinophils Relative: 0 %
HCT: 45.8 % (ref 39.0–52.0)
HEMOGLOBIN: 15.3 g/dL (ref 13.0–17.0)
Immature Granulocytes: 2 %
LYMPHS ABS: 2.1 10*3/uL (ref 0.7–4.0)
Lymphocytes Relative: 8 %
MCH: 29.8 pg (ref 26.0–34.0)
MCHC: 33.4 g/dL (ref 30.0–36.0)
MCV: 89.1 fL (ref 80.0–100.0)
Monocytes Absolute: 2.2 10*3/uL — ABNORMAL HIGH (ref 0.1–1.0)
Monocytes Relative: 8 %
NEUTROS PCT: 82 %
Neutro Abs: 21.6 10*3/uL — ABNORMAL HIGH (ref 1.7–7.7)
Platelets: 302 10*3/uL (ref 150–400)
RBC: 5.14 MIL/uL (ref 4.22–5.81)
RDW: 14.2 % (ref 11.5–15.5)
WBC: 26.5 10*3/uL — ABNORMAL HIGH (ref 4.0–10.5)
nRBC: 0 % (ref 0.0–0.2)

## 2017-11-19 LAB — BLOOD GAS, ARTERIAL
ACID-BASE DEFICIT: 1.8 mmol/L (ref 0.0–2.0)
BICARBONATE: 25.2 mmol/L (ref 20.0–28.0)
FIO2: 0.6
MECHVT: 500 mL
O2 SAT: 93.9 %
PATIENT TEMPERATURE: 37
PCO2 ART: 50 mmHg — AB (ref 32.0–48.0)
PEEP: 5 cmH2O
PH ART: 7.31 — AB (ref 7.350–7.450)
PO2 ART: 77 mmHg — AB (ref 83.0–108.0)
RATE: 20 resp/min

## 2017-11-19 LAB — TROPONIN I
TROPONIN I: 1.09 ng/mL — AB (ref <0.03)
Troponin I: 0.71 ng/mL (ref <0.03)

## 2017-11-19 LAB — NA AND K (SODIUM & POTASSIUM), RAND UR
Potassium Urine: 39 mmol/L
Sodium, Ur: 41 mmol/L

## 2017-11-19 LAB — LACTIC ACID, PLASMA
Lactic Acid, Venous: 3.8 mmol/L (ref 0.5–1.9)
Lactic Acid, Venous: 2.9 mmol/L (ref 0.5–1.9)

## 2017-11-19 LAB — PROTEIN, URINE, RANDOM: TOTAL PROTEIN, URINE: 16 mg/dL

## 2017-11-19 LAB — LIPASE, BLOOD: LIPASE: 20 U/L (ref 11–51)

## 2017-11-19 LAB — APTT: APTT: 30 s (ref 24–36)

## 2017-11-19 LAB — MAGNESIUM: MAGNESIUM: 2 mg/dL (ref 1.7–2.4)

## 2017-11-19 LAB — ABO/RH: ABO/RH(D): O POS

## 2017-11-19 MED ORDER — KETAMINE HCL 10 MG/ML IJ SOLN
100.0000 mg | Freq: Once | INTRAMUSCULAR | Status: AC
  Administered 2017-11-19: 100 mg via INTRAVENOUS

## 2017-11-19 MED ORDER — FENTANYL 2500MCG IN NS 250ML (10MCG/ML) PREMIX INFUSION
100.0000 ug/h | INTRAVENOUS | Status: DC
  Administered 2017-11-19: 100 ug/h via INTRAVENOUS
  Filled 2017-11-19: qty 250

## 2017-11-19 MED ORDER — SODIUM CHLORIDE 0.9 % IV SOLN: 3.0000 g | Freq: Three times a day (TID) | INTRAVENOUS | Status: DC

## 2017-11-19 MED ORDER — PROPOFOL 1000 MG/100ML IV EMUL
5.0000 ug/kg/min | INTRAVENOUS | Status: DC
  Filled 2017-11-19: qty 200

## 2017-11-19 MED ORDER — SODIUM CHLORIDE 0.9% IV SOLUTION
Freq: Once | INTRAVENOUS | Status: DC
  Filled 2017-11-19: qty 250

## 2017-11-19 MED ORDER — ROCURONIUM BROMIDE 50 MG/5ML IV SOLN
100.0000 mg | Freq: Once | INTRAVENOUS | Status: AC
  Administered 2017-11-19: 100 mg via INTRAVENOUS
  Filled 2017-11-19: qty 10

## 2017-11-19 MED ORDER — PROTHROMBIN COMPLEX CONC HUMAN 500 UNITS IV KIT: 2000.0000 [IU] | PACK | Freq: Once | Status: DC

## 2017-11-19 MED ORDER — SODIUM CHLORIDE 0.9 % IV SOLN
3.0000 g | Freq: Four times a day (QID) | INTRAVENOUS | Status: DC
  Administered 2017-11-19: 3 g via INTRAVENOUS
  Filled 2017-11-19: qty 3

## 2017-11-19 MED ORDER — PANTOPRAZOLE SODIUM 40 MG IV SOLR: 40.0000 mg | Freq: Two times a day (BID) | INTRAVENOUS | Status: DC

## 2017-11-19 MED ORDER — SODIUM CHLORIDE 0.9 % IV BOLUS
1000.0000 mL | Freq: Once | INTRAVENOUS | Status: AC
  Administered 2017-11-19: 1000 mL via INTRAVENOUS

## 2017-11-19 MED ORDER — PANTOPRAZOLE SODIUM 40 MG IV SOLR
40.0000 mg | Freq: Once | INTRAVENOUS | Status: AC
  Administered 2017-11-19: 40 mg via INTRAVENOUS
  Filled 2017-11-19: qty 40

## 2017-11-19 MED ORDER — PROPOFOL 1000 MG/100ML IV EMUL: 5.0000 ug/kg/min | Freq: Once | INTRAVENOUS | Status: DC

## 2017-11-19 MED ORDER — FENTANYL 2500MCG IN NS 250ML (10MCG/ML) PREMIX INFUSION: 0.0000 ug/h | INTRAVENOUS | Status: DC

## 2017-11-19 MED ORDER — LACTATED RINGERS IV SOLN
INTRAVENOUS | Status: DC
  Administered 2017-11-19: 10:00:00 via INTRAVENOUS

## 2017-11-19 MED ORDER — TRANEXAMIC ACID-NACL 1000-0.7 MG/100ML-% IV SOLN
1000.0000 mg | Freq: Once | INTRAVENOUS | Status: AC
  Administered 2017-11-19: 1000 mg via INTRAVENOUS
  Filled 2017-11-19: qty 100

## 2017-11-19 MED ORDER — VITAMIN K1 10 MG/ML IJ SOLN
10.0000 mg | Freq: Once | INTRAVENOUS | Status: DC
  Filled 2017-11-19: qty 1

## 2017-11-19 MED ORDER — SODIUM CHLORIDE 0.9 % IV SOLN: 1.0000 g | Freq: Once | INTRAVENOUS | Status: DC

## 2017-11-19 MED ORDER — PROPOFOL 1000 MG/100ML IV EMUL
5.0000 ug/kg/min | Freq: Once | INTRAVENOUS | Status: DC
  Administered 2017-11-19: 5 ug/kg/min via INTRAVENOUS

## 2017-11-19 NOTE — ED Notes (Signed)
Propofol gtt backed down to 20 mcg/kg.

## 2017-11-19 NOTE — ED Notes (Signed)
Blanket warmer applied

## 2017-11-19 NOTE — ED Notes (Signed)
7.5ETT inserted by Dr Lamont Snowball, 23 at the lip; placement verified by auscultation and CO2 detector

## 2017-11-19 NOTE — ED Provider Notes (Signed)
[facility] Emergency Department Provider Note  ____________________________________________   First MD Initiated Contact with Patient 11/19/17 [phone]     (approximate)  I have reviewed the triage vital signs and the nursing notes.   HISTORY  Chief Complaint Hematemesis  Level 5 exemption history limited by the patient's critical illness  HPI Steven [name] is a 57 y.o. male who comes to the emergency department by EMS after his roommate found him lying on the ground in the kitchen with reportedly 2 L of bloody vomit around him.  Apparently the patient went to the kitchen around midnight roughly 6 hours prior to arrival and vomited a large amount and collapsed to the ground.  He was unable to reach a phone and only once his roommate found him did she call 911.  The patient is not sure if he takes any blood thinning medicine.  He said he vomited "more times than I can count".  Further history is limited as the patient is critically ill and continues to vomit and is becoming obtunded.    Past Medical History:  Diagnosis Date  . COPD (chronic obstructive pulmonary disease) ([place])   . CVA (cerebral vascular accident) ([place])   . Hypertension   . Substance abuse [facility])     Patient Active Problem List   Diagnosis Date Noted  . Upper GI bleed 11/19/2017  . Dysphagia 04/17/2016  . Esophageal spasm 04/17/2016  . Acute renal insufficiency 04/17/2016  . Essential hypertension 04/17/2016  . Leukocytosis 04/17/2016  . Elevated troponin   . CVA (cerebral vascular accident) ([place]) 04/16/2016  . Cerebrovascular accident (CVA) ([place])   . At high risk for aspiration   . Palliative care by specialist   . Goals of care, counseling/discussion   . Chest pain 04/14/2016    Past Surgical History:  Procedure Laterality Date  . APPENDECTOMY    . ESOPHAGOGASTRODUODENOSCOPY (EGD) WITH PROPOFOL N/A 03/04/2017   Procedure: ESOPHAGOGASTRODUODENOSCOPY (EGD) WITH PROPOFOL;  Surgeon:  Lollie Sails, MD;  Location: [facility] ENDOSCOPY;  Service: Endoscopy;  Laterality: N/A;  . TEE WITHOUT CARDIOVERSION N/A 04/17/2016   Procedure: TRANSESOPHAGEAL ECHOCARDIOGRAM (TEE);  Surgeon: Corey Skains, MD;  Location: [facility] ORS;  Service: Cardiovascular;  Laterality: N/A;    Prior to Admission medications   Medication Sig Start Date End Date Taking? Authorizing Provider  aspirin 81 MG EC tablet Take 1 tablet (81 mg total) by mouth daily. 04/22/16   Carrie Mew, MD  baclofen (LIORESAL) 10 MG tablet Take 1 tablet (10 mg total) by mouth 2 (two) times daily. 04/22/16   Carrie Mew, MD  lisinopril (PRINIVIL,ZESTRIL) 20 MG tablet Take 1 tablet (20 mg total) by mouth daily. 04/22/16   Carrie Mew, MD  metoprolol tartrate (LOPRESSOR) 25 MG tablet Take 0.5 tablets (12.5 mg total) by mouth 2 (two) times daily. 04/22/16   Carrie Mew, MD  naproxen (NAPROSYN) 500 MG tablet Take 1 tablet (500 mg total) by mouth 2 (two) times daily with a meal. 02/21/17   Lavonia Drafts, MD  pantoprazole (PROTONIX) 40 MG tablet Take 40 mg by mouth daily.    [provider]  polyethylene glycol (MIRALAX / GLYCOLAX) packet Take 17 g by mouth daily.    [provider]    Allergies Statins and Feldene [piroxicam]  Family History  Problem Relation Age of Onset  . Hypertension Father     Social History Social History   Tobacco Use  . Smoking status: Current Every Day Smoker  Packs/day: 1.00    Types: Cigarettes  . Smokeless tobacco: Never Used  Substance Use Topics  . Alcohol use: Yes    Alcohol/week: 1.0 standard drinks    Types: 1 Cans of beer per week  . Drug use: Yes    Types: Cocaine    Review of Systems Level 5 exemption history limited by the patient's clinical condition ____________________________________________   PHYSICAL EXAM:  VITAL SIGNS: ED Triage Vitals  Enc Vitals Group     BP 11/19/17 0617 107/81     Pulse Rate 11/19/17 0617 (!) 115      Resp 11/19/17 0617 (!) 22     Temp --      Temp src --      SpO2 11/19/17 0617 95 %     Weight 11/19/17 0620 175 lb 4.3 oz (79.5 kg)     Height 11/19/17 0620 _0  (1.626 m)     Head Circumference --      Peak Flow --      Pain Score 11/19/17 0619 0     Pain Loc --      Pain Edu? --      Excl. in [place]? --     Constitutional: Appears critically ill covered in dark black vomit on his chest and in his beard.  Elevated respiratory rate and diaphoretic. Eyes: PERRL EOMI. midrange and brisk Head: Atraumatic. Nose: No congestion/rhinnorhea. Mouth/Throat: Large amount of vomit and dark black material on his tongue and his oropharynx and on his chin Neck: No stridor.   Cardiovascular: Tachycardic rate, regular rhythm. Grossly normal heart sounds.  Good peripheral circulation. Respiratory: Creased respiratory effort with crackles bilaterally Gastrointestinal: Morbidly obese soft nontender Musculoskeletal: No lower extremity edema   Neurologic: Moves all 4 Skin: Diaphoretic Psychiatric: Anxious and becoming obtunded    ____________________________________________   DIFFERENTIAL includes but not limited to  Hemorrhagic shock, upper GI bleed, lower GI bleed, esophageal varices, aspiration ____________________________________________   LABS (all labs ordered are listed, but only abnormal results are displayed)  Labs Reviewed  CBC WITH DIFFERENTIAL/PLATELET - Abnormal; Notable for the following components:      Result Value   WBC 26.5 (*)    Neutro Abs 21.6 (*)    Monocytes Absolute 2.2 (*)    Abs Immature Granulocytes 0.49 (*)    All other components within normal limits  COMPREHENSIVE METABOLIC PANEL - Abnormal; Notable for the following components:   Glucose, Bld 229 (*)    BUN 47 (*)    Creatinine, Ser 2.03 (*)    Calcium 8.4 (*)    GFR calc non Af Amer 35 (*)    GFR calc Af Amer 40 (*)    All other components within normal limits  TROPONIN I - Abnormal; Notable for the  following components:   Troponin I 0.71 (*)    All other components within normal limits  LACTIC ACID, PLASMA - Abnormal; Notable for the following components:   Lactic Acid, Venous 3.8 (*)    All other components within normal limits  BLOOD GAS, ARTERIAL - Abnormal; Notable for the following components:   pH, Arterial 7.31 (*)    pCO2 arterial 50 (*)    pO2, Arterial 77 (*)    All other components within normal limits  URINALYSIS, COMPLETE (UACMP) WITH MICROSCOPIC - Abnormal; Notable for the following components:   Color, Urine AMBER (*)    APPearance HAZY (*)    Glucose, UA 50 (*)    Ketones, ur 5 (*)  Protein, ur 100 (*)    All other components within normal limits  PROTIME-INR  LIPASE, BLOOD  LACTIC ACID, PLASMA  APTT  MAGNESIUM  PHOSPHORUS  TYPE AND SCREEN  PREPARE RBC (CROSSMATCH)  PREPARE RBC (CROSSMATCH)    Lab work reviewed by me with a number of abnormalities.  Elevated BUN likely secondary to acute kidney injury as well as upper GI bleed.  Elevated troponin likely secondary to demand not true primary myocardial ischemia.  Elevated lactic acid is not indicative of sepsis and is consistent with under resuscitation. __________________________________________  EKG  ED ECG REPORT I, Darel Hong, the attending physician, personally viewed and interpreted this ECG.  Date: 11/19/2017 EKG Time:  Rate: 126 Rhythm: Sinus tachycardia QRS Axis: Leftward axis Intervals: normal ST/T Wave abnormalities: Inferior and lateral T wave inversion and ST depression along with some elevation in aVR consistent with global subendocardial ischemia Narrative Interpretation: Concerning for global ischemia but not suggestive of coronary occlusion  ____________________________________________  RADIOLOGY  Chest x-ray reviewed by me shows ET tube and OG tube in good position ____________________________________________   PROCEDURES  Procedure(s) performed: Yes  Procedure  Name: Intubation Date/Time: 11/19/2017 7:49 AM Performed by: Darel Hong, MD Pre-anesthesia Checklist: Patient identified, Patient being monitored, Emergency Drugs available, Timeout performed and Suction available Oxygen Delivery Method: Nasal cannula Preoxygenation: Pre-oxygenation with 100% oxygen Induction Type: Rapid sequence Ventilation: Mask ventilation without difficulty Laryngoscope Size: Mac and 4 Grade View: Grade II Tube size: 7.5 mm Number of attempts: 1 Placement Confirmation: ETT inserted through vocal cords under direct vision,  CO2 detector and Breath sounds checked- equal and bilateral Secured at: 23 cm Tube secured with: ETT holder    .Central Line Date/Time: 11/19/2017 7:49 AM Performed by: Darel Hong, MD Authorized by: Darel Hong, MD   Consent:    Consent obtained:  Emergent situation Pre-procedure details:    Hand hygiene: Hand hygiene performed prior to insertion     Skin preparation:  2% chlorhexidine   Skin preparation agent: Skin preparation agent completely dried prior to procedure   Anesthesia (see MAR for exact dosages):    Anesthesia method:  None Procedure details:    Location:  R femoral   Site selection rationale:  Unable to tolerate head down   Patient position:  Trendelenburg   Procedural supplies:  Cordis   Landmarks identified: yes     Ultrasound guidance: no     Number of attempts:  1   Successful placement: yes   Post-procedure details:    Post-procedure:  Dressing applied and line sutured   Assessment:  Blood return through all ports and free fluid flow   Patient tolerance of procedure:  Tolerated well, no immediate complications Comments:     I placed a right femoral Cordis central line under emergent conditions.  The patient had no IV access and was acutely decompensating.  I did use chlorhexidine and sterile gloves however the procedure itself was not completely sterile.  I used landmarks to perform the procedure  and he tolerated well.  I did update the hospitalist regarding the need to change out this central line. .Critical Care Performed by: Darel Hong, MD Authorized by: Darel Hong, MD   Critical care provider statement:    Critical care time (minutes):  45   Critical care time was exclusive of:  Separately billable procedures and treating other patients   Critical care was necessary to treat or prevent imminent or life-threatening deterioration of the following conditions:  Respiratory failure and  shock   Critical care was time spent personally by me on the following activities:  Development of treatment plan with patient or surrogate, discussions with consultants, evaluation of patient's response to treatment, examination of patient, obtaining history from patient or surrogate, ordering and performing treatments and interventions, ordering and review of laboratory studies, ordering and review of radiographic studies, pulse oximetry, re-evaluation of patient's condition and review of old charts OG placement Date/Time: 11/19/2017 7:50 AM Performed by: Darel Hong, MD Authorized by: Darel Hong, MD  Consent: The procedure was performed in an emergent situation. Patient identity confirmed: verbally with patient and arm band Local anesthesia used: no  Anesthesia: Local anesthesia used: no  Sedation: Patient sedated: yes Sedatives: ketamine and fentanyl Vitals: Vital signs were monitored during sedation.  Patient tolerance: Patient tolerated the procedure well with no immediate complications    Angiocath insertion Performed by: Darel Hong  Consent: Verbal consent obtained. Risks and benefits: risks, benefits and alternatives were discussed Time out: Immediately prior to procedure a "time out" was called to verify the correct patient, procedure, equipment, support staff and site/side marked as required.  Preparation: Patient was prepped and draped in the usual sterile  fashion.  Vein Location: Right EJ  Gauge: 20  Normal blood return and flush without difficulty Patient tolerance: Patient tolerated the procedure well with no immediate complications.   Angiocath insertion Performed by: Darel Hong  Consent: Verbal consent obtained. Risks and benefits: risks, benefits and alternatives were discussed Time out: Immediately prior to procedure a "time out" was called to verify the correct patient, procedure, equipment, support staff and site/side marked as required.  Preparation: Patient was prepped and draped in the usual sterile fashion.  Vein Location: Left upper arm  Ultrasound Guided  Gauge: 18  Normal blood return and flush without difficulty Patient tolerance: Patient tolerated the procedure well with no immediate complications.     Critical Care performed: Yes  ____________________________________________   INITIAL IMPRESSION / ASSESSMENT AND PLAN / ED COURSE  Pertinent labs & imaging results that were available during my care of the patient were reviewed by me and considered in my medical decision making (see chart for details).   As part of my medical decision making, I reviewed the following data within the [place] History obtained from family if available, nursing notes, old chart and ekg, as well as notes from prior ED visits.  The patient comes to the emergency department critically ill-appearing covered in vomit in his beard tachycardic and unable to measure a blood pressure.  He was initially 100 over palp.  I initially placed a right EJ Angiocath however the patient was unable to tolerate being in Trendelenburg and he continued to vomit.  He was diaphoretic and ill-appearing and decision was made to intubate for airway protection and for the patient's predicted clinical course.  I placed a right femoral Cordis central line under semi-sterile conditions and intubated the patient using ketamine and  rocuronium via the right femoral line.  I did encounter a large amount of dark black blood in the airway.  I then ordered 2 units of O+ emergent blood as well as Protonix.  He is sedated with propofol and fentanyl infusions.  On chart review the patient had an endoscopy 9 months ago and has no known cirrhosis or varices and at that time had esophagitis.  He does take nonsteroidals so this possibly could be a gastric ulcer.  I then had a lengthy discussion with the patient's  daughter and brought her into the room to be with her father.  I also spoke with on-call gastroenterologist Dr. Vicente Males who will kindly consult on the case and he agrees with continued resuscitation prior to endoscopy.  I then spoke with the hospitalist who has graciously agreed to admit the patient to his service.  The hospitalist verbalized understanding that the central line was not fully sterile and needs to be changed within 24 hours.  The patient's hemodynamics are slowly improving although he does remain critically ill.      ____________________________________________   FINAL CLINICAL IMPRESSION(S) / ED DIAGNOSES  Final diagnoses:  Shock ([place])  Upper GI bleed      NEW MEDICATIONS STARTED DURING THIS VISIT:  New Prescriptions   No medications on file     Note:  This document was prepared using Dragon voice recognition software and may include unintentional dictation errors.     Darel Hong, MD 11/19/17 0800

## 2017-11-19 NOTE — ED Notes (Signed)
Pt reaching for ET Tube, choking. Pt suctioned and propofol increased to 25 mcg/kg.

## 2017-11-19 NOTE — ED Notes (Signed)
Late note - see propofol titration for times.  Pt reaching for ET Tube, choking. Pt suctioned and propofol increased to 7 mcg/kg and then to 10 mcg/kg.    Pt reaching for ET Tube, choking. Pt suctioned and propofol increased to 15  mcg/kg.

## 2017-11-19 NOTE — ED Notes (Signed)
Pt reaching for ET Tube, choking. Pt suctioned and propofol increased to 20 mcg

## 2017-11-19 NOTE — ED Triage Notes (Addendum)
Pt to room 19 via EMS from home where SO found him awake, lying in bathroom floor with large amount blood; denies LOC, pt A&Ox3; st "just got weak";Dr Rifenbark at bedside and RT paged to room; pt with dried blood on face,arms; pt undressed, placed in hosp gown & on card monitor

## 2017-11-19 NOTE — ED Notes (Signed)
Blood transfusion [id number] completed. No s/s of reaction.

## 2017-11-19 NOTE — ED Notes (Signed)
Propofol line pulled loose. Line exchanged.

## 2017-11-19 NOTE — H&P (Signed)
[employer] [facility] [place] [facility]   PATIENT NAME: Steven [name]    MR#:  [id number]  DATE OF BIRTH:  09-Jul-1960  DATE OF ADMISSION:  11/19/2017  PRIMARY CARE PHYSICIAN: Sifferman, Dutch Gray, PA-C   REQUESTING/REFERRING PHYSICIAN: Merrily Brittle, MD  CHIEF COMPLAINT:   Chief Complaint  Patient presents with  . Hematemesis    HISTORY OF PRESENT ILLNESS:  Steven [name]  is a 57 y.o. male with a known history of GERD, esophagitis (LA grade B, per 03/04/2017 EGD), Barrett's esophagus, sliding hiatal hernia, Hx CVA (w/ dysphagia), HTN, COPD p/w UGIB/hematemesis. Pt severely hypotensive on ED arrival, intubated prior to my assessment, Hx/ROS unobtainable. Pt's daughter arrived later during my assessment, but was not present at the time EMS was called. She tells me the pt lives with a roommate. He has been on antiemetics, but had apparently run out, and has been vomiting/retching intractable for several days. It is not entirely clear what happened in the interrim, but the pt was found down by his roommate. I was told there was something to the effect of 2L of blood on the floor surrounding him. He apparently told EMS that he was lying down on the ground because he felt weak. He denied syncope to EMS. Not on systemic AC to my knowledge. Has seen Dr. Marva Panda in the past, 03/04/2017 EGD demonstrated, "LA Grade B erosive esophagitis and small hiatal hernia." He is receiving blood products at the time of my assessment. There is ~100cc of dark brownish vomitus in the suction canister. His BP has stabilized. He is admitted to ICU. I have informed his daughter of the high risk of mortality in this circumstance.  PAST MEDICAL HISTORY:   Past Medical History:  Diagnosis Date  . COPD (chronic obstructive pulmonary disease) (HCC)   . CVA (cerebral vascular accident) (HCC)   . Hypertension   . Substance abuse (HCC)     PAST SURGICAL HISTORY:   Past Surgical History:  Procedure  Laterality Date  . APPENDECTOMY    . ESOPHAGOGASTRODUODENOSCOPY (EGD) WITH PROPOFOL N/A 03/04/2017   Procedure: ESOPHAGOGASTRODUODENOSCOPY (EGD) WITH PROPOFOL;  Surgeon: Christena Deem, MD;  Location: [facility] ENDOSCOPY;  Service: Endoscopy;  Laterality: N/A;  . TEE WITHOUT CARDIOVERSION N/A 04/17/2016   Procedure: TRANSESOPHAGEAL ECHOCARDIOGRAM (TEE);  Surgeon: Lamar Blinks, MD;  Location: [facility] ORS;  Service: Cardiovascular;  Laterality: N/A;    SOCIAL HISTORY:   Social History   Tobacco Use  . Smoking status: Current Every Day Smoker    Packs/day: 1.00    Types: Cigarettes  . Smokeless tobacco: Never Used  Substance Use Topics  . Alcohol use: Yes    Alcohol/week: 1.0 standard drinks    Types: 1 Cans of beer per week    FAMILY HISTORY:   Family History  Problem Relation Age of Onset  . Hypertension Father     DRUG ALLERGIES:   Allergies  Allergen Reactions  . Statins     Other reaction(s): Other (See Comments) Weakness  . Feldene [Piroxicam] Rash    REVIEW OF SYSTEMS:   Review of Systems  Unable to perform ROS: Intubated  Gastrointestinal: Positive for vomiting.   MEDICATIONS AT HOME:   Prior to Admission medications   Medication Sig Start Date End Date Taking? Authorizing Provider  albuterol (PROAIR HFA) 108 (90 Base) MCG/ACT inhaler Inhale 2 puffs into the lungs every 4 (four) hours as needed for wheezing. 04/01/17  Yes [provider]  aspirin 81 MG EC  tablet Take 1 tablet (81 mg total) by mouth daily. 04/22/16  Yes Sharman Cheek, MD  baclofen (LIORESAL) 10 MG tablet Take 1 tablet (10 mg total) by mouth 2 (two) times daily. 04/22/16  Yes Sharman Cheek, MD  lisinopril (PRINIVIL,ZESTRIL) 20 MG tablet Take 1 tablet (20 mg total) by mouth daily. 04/22/16  Yes Sharman Cheek, MD  metoprolol tartrate (LOPRESSOR) 25 MG tablet Take 0.5 tablets (12.5 mg total) by mouth 2 (two) times daily. 04/22/16  Yes Sharman Cheek, MD  pantoprazole (PROTONIX) 40  MG tablet Take 40 mg by mouth 2 (two) times daily.    Yes [provider]  polyethylene glycol (MIRALAX / GLYCOLAX) packet Take 17 g by mouth daily.   Yes [provider]  tiotropium (SPIRIVA HANDIHALER) 18 MCG inhalation capsule Place 18 mcg into inhaler and inhale daily. 04/06/17  Yes [provider]  naproxen (NAPROSYN) 500 MG tablet Take 1 tablet (500 mg total) by mouth 2 (two) times daily with a meal. Patient not taking: Reported on 11/19/2017 02/21/17   Jene Every, MD      VITAL SIGNS:  Blood pressure 125/88, pulse (!) 122, temperature 98 F (36.7 C), resp. rate 20, height 5\' 4"  (1.626 m), weight 79.5 kg, SpO2 99 %.  PHYSICAL EXAMINATION:  Physical Exam  Constitutional: He appears well-developed and well-nourished. He is sleeping. He appears ill. He is sedated and intubated.  Eyes: Conjunctivae and lids are normal. No scleral icterus.  Neck: Neck supple. No JVD present. No thyromegaly present.  Cardiovascular: Regular rhythm, S1 normal and S2 normal.  No extrasystoles are present. Tachycardia present. Exam reveals no gallop, no S3, no S4, no distant heart sounds and no friction rub.  No murmur heard. Pulmonary/Chest: No accessory muscle usage or stridor. No apnea, no tachypnea and no bradypnea. He is intubated. No respiratory distress. He has decreased breath sounds in the left upper field, the left middle field and the left lower field. He has no wheezes. He has rhonchi in the right upper field, the right middle field and the right lower field. He has no rales.  Abdominal: Soft. He exhibits no distension. Bowel sounds are decreased. There is no tenderness. There is no rigidity, no rebound and no guarding.  Musculoskeletal: He exhibits no edema.  Lymphadenopathy:    He has no cervical adenopathy.  Neurological: He is unresponsive.  Intubated/sedated.  Skin: Skin is warm and dry. No rash noted. He is not diaphoretic. No erythema. There is pallor.    Psychiatric: He is noncommunicative.  Intubated/sedated. He is inattentive.   LABORATORY PANEL:   CBC Recent Labs  Lab 11/19/17 0632  WBC 26.5*  HGB 15.3  HCT 45.8  PLT 302   ------------------------------------------------------------------------------------------------------------------  Chemistries  Recent Labs  Lab 11/19/17 0632  NA 141  K 3.6  CL 102  CO2 26  GLUCOSE 229*  BUN 47*  CREATININE 2.03*  CALCIUM 8.4*  MG 2.0  AST 26  ALT 29  ALKPHOS 69  BILITOT 0.6   ------------------------------------------------------------------------------------------------------------------  Cardiac Enzymes Recent Labs  Lab 11/19/17 0633  TROPONINI 0.71*   ------------------------------------------------------------------------------------------------------------------  RADIOLOGY:  Dg Chest Portable 1 View  Result Date: 11/19/2017 CLINICAL DATA:  Intubation. EXAM: PORTABLE CHEST 1 VIEW COMPARISON:  04/14/2016 FINDINGS: Endotracheal tube tip 4.4 cm from the carina. Enteric tube tip and side-port below the diaphragm in the stomach. Small bore catheter overlies the right supraclavicular region. Upper normal heart size with unchanged mediastinal contours. Chronic interstitial coarsening. Bibasilar atelectasis without confluent airspace  disease. No large pleural effusion. No pneumothorax. Unchanged osseous structures. Presumed external artifact in the right axilla (may be a pen). IMPRESSION: 1. Endotracheal tube tip 4.4 cm from the carina. Enteric tube tip and side-port below the diaphragm in the stomach. 2. Streaky bibasilar atelectasis. 3. Chronic interstitial coarsening. Electronically Signed   By: Narda Rutherford M.D.   On: 11/19/2017 06:57   IMPRESSION AND PLAN:   A/P: 77M UGIB/hematemesis, hypotension, acute blood loss, intubated. Suspect Mallory-Weiss tears vs. bleeding gastric ulcer. R lung rhonchi, CXR (-), nonetheless concerning for aspiration. Troponin elevation,  lactate elevation, hyperglycemia, renal failure, hypocalcemia, leukocytosis. -UGIB/hematemesis, hypotension, acute blood loss, intubated: UGIB/hematemesis as per HPI. Preceded by retching/vomiting. Suspect Mallory-Weiss tears. Has Hx of esophagitis, GERD, sliding hiatal hernia; bleeding ulcer is also possible. Protonix. GI consult for EGD. Blood products as appropriate. -R lung rhonchi, lactate elevation, leukocytosis: SIRS (+). R lung rhonchi. CXR (-) infiltrate/consolidation. Lactate 3.8. WBC 26.5. These findings may be due to UGIB/hematemesis w/ hypotension and shock, but it is also possible there is a component of aspiration. Given the acuity/severity of presentation, I have chosen to cover with Unasyn for now. -Troponin elevation, lactate elevation: Likely 2/2 hypotension/hypoperfusion.  -Renal failure: Appears to be in AKI, Cr 2.03, baseline 0.8-1.1 based on review of prior labs. Likely 2/2 hypotension/hypoperfusion, prerenal AKI vs. ATN. USpGr 1.027, (-) isosthenuria, making ATN less likely. Urine studies (electrolytes, protein, creatinine, urea nitrogen). IVF. Monitor BMP, avoid nephrotoxins. -Hypocalcemia: Ionized calcium. -Hold home meds. -FEN/GI: NPO. -DVT PPx: SCDs, no pharmacological DVT PPx (bleeding). -Code status: Full code. -Disposition: Admission, > 2 midnights. High risk of mortality.   All the records are reviewed and case discussed with ED provider. Management plans discussed with the patient, family and they are in agreement.  CODE STATUS: Full code.  TOTAL TIME TAKING CARE OF THIS PATIENT: 75 minutes.    Barbaraann Rondo M.D on 11/19/2017 at 9:53 AM  Between 7am to 6pm - Pager - [phone]  After 6pm go to [email or web address] - Social research officer, government  [facility]  Office  [phone]  CC: Primary care physician; Guadlupe Spanish, PA-C   Note: This dictation was prepared with Dragon dictation along with smaller phrase technology. Any  transcriptional errors that result from this process are unintentional.

## 2017-11-19 NOTE — ED Notes (Signed)
ABG drawn by RT

## 2017-11-19 NOTE — ED Notes (Addendum)
1st unit PRBC completed; 2nd unit emergent blood hung to infuse, unit [id number], O+, exp 12/05/17; ; verified by this nurse and Solon Augusta RN

## 2017-11-19 NOTE — ED Notes (Signed)
Daughter at bedside now; reports pt has been vomiting last few days and ran out of his nausea med; hospitalist at bedside

## 2017-11-19 NOTE — ED Notes (Signed)
Report given to [facility] with [facility].

## 2017-11-19 NOTE — ED Notes (Signed)
PCXR completed.

## 2017-11-19 NOTE — Consult Note (Signed)
Informed by ER patient being transferred to [facility] due to no ICU beds  Dr Wyline Mood MD,MRCP [facility]) Gastroenterology/Hepatology Pager: [phone]

## 2017-11-19 NOTE — ED Notes (Signed)
Bair hugger warmer removed - see temp

## 2017-11-19 NOTE — ED Notes (Addendum)
1st unit Emergent blood initiated, unit #[id number]; , O+, exp 12/06/17; verified by Solon Augusta and this nurse

## 2017-11-19 NOTE — ED Notes (Signed)
OG withdrew to 54 at the lip per MD verification of CXR

## 2017-11-19 NOTE — ED Notes (Signed)
Two bottles propofol removed from pixis and given to [facility] for pt transfer. Pt on propofol gtt on transfer.

## 2017-11-19 NOTE — ED Provider Notes (Signed)
-----------------------------------------   11:44 AM on 11/19/2017 -----------------------------------------  I took over care of this patient from Dr. Lamont Snowball.  The patient had been admitted and report given to the hospitalist however the patient required ICU level care and at the time of shift change, there was no bed available in the ICU.  We continue to monitor the patient in the ED.  His vital signs have now stabilized after blood and fluids.  There is minimal output of blood from the OG tube.  His lactate is trending down.  I initially waited to see if there was a possibility in ICU bed would open here, but was told by the charge RN that there is no availability in ICU at least for the next 24 hours.  [facility] had already been contacted and had no ICU beds.  I contacted the transfer center at [facility].  Dr. Dustin Flock kindly agreed to accept the patient as a transfer to the MICU there.  The patient's daughter is at bedside and I have kept her informed of the plan.  She agrees with the transfer.  The patient is stable for transfer at this time.  [facility] team is en route.   Dionne Bucy, MD 11/19/17 1146

## 2017-11-19 NOTE — Progress Notes (Signed)
Pharmacy Antibiotic Note  Steven [name] is a 57 y.o. male admitted on 11/19/2017 with aspiration pneumonia.  Pharmacy has been consulted for Ampicillin/Sulbactam dosing.  Plan: Ampicillin/Sulbactam 3g IV every 6 hours.  Height: 5\' 4"  (162.6 cm) Weight: 175 lb 4.3 oz (79.5 kg) IBW/kg (Calculated) : 59.2  Temp (24hrs), Avg:95.8 F (35.4 C), Min:94 F (34.4 C), Max:96.6 F (35.9 C)  Recent Labs  Lab 11/19/17 0632 11/19/17 0633  WBC 26.5*  --   CREATININE 2.03*  --   LATICACIDVEN  --  3.8*    Estimated Creatinine Clearance: 38.2 mL/min (A) (by C-G formula based on SCr of 2.03 mg/dL (H)).    Allergies  Allergen Reactions  . Statins     Other reaction(s): Other (See Comments) Weakness  . Feldene [Piroxicam] Rash    Antimicrobials this admission: Ampicillin/Sulbactam 10/25 >>   Dose adjustments this admission: N/A  Microbiology results: N/A   Thank you for allowing pharmacy to be a part of this patient's care.  Stormy Card, [facility] 11/19/2017 8:10 AM

## 2017-11-19 NOTE — ED Notes (Signed)
EDP notified in person of Lactic 2.9.

## 2017-11-19 NOTE — ED Notes (Signed)
EMTALA reviewed by Irven Shelling.

## 2017-11-19 NOTE — ED Notes (Addendum)
Pt currently dry heaving; will prep pt for intub per MD; 20G cath inserted to right jugular by MD; no blood return noted; IV d/c'd--cannula intact, dsng applied; Single lumen cath inserted into right femoral by Dr Lamont Snowball; excellent blood return noted

## 2017-11-19 NOTE — ED Notes (Signed)
Pt tolerating 30mcg/kg well. More relaxed. Will continue to monitor.

## 2017-11-19 NOTE — ED Notes (Addendum)
16Fr OG inserted by Dr Lamont Snowball, 65 at the lip

## 2017-11-20 LAB — TYPE AND SCREEN
ABO/RH(D): O POS
ANTIBODY SCREEN: NEGATIVE
UNIT DIVISION: 0
Unit division: 0

## 2017-11-20 LAB — UREA NITROGEN, URINE: Urea Nitrogen, Ur: 1456 mg/dL

## 2017-11-20 LAB — BPAM RBC
Blood Product Expiration Date: 201911102359
Blood Product Expiration Date: 201911112359
ISSUE DATE / TIME: 201910250630
ISSUE DATE / TIME: 201910250630
UNIT TYPE AND RH: 5100
Unit Type and Rh: 5100

## 2017-11-20 LAB — CALCIUM, IONIZED: Calcium, Ionized, Serum: 4.4 mg/dL — ABNORMAL LOW (ref 4.5–5.6)

## 2017-11-20 MED ORDER — TIOTROPIUM BROMIDE MONOHYDRATE 18 MCG IN CAPS
18.00 | ORAL_CAPSULE | RESPIRATORY_TRACT | Status: DC
Start: 2017-11-26 — End: 2017-11-20

## 2017-11-20 MED ORDER — GENERIC EXTERNAL MEDICATION
Status: DC
Start: ? — End: 2017-11-20

## 2017-11-20 MED ORDER — GENERIC EXTERNAL MEDICATION
40.00 | Status: DC
Start: 2017-11-23 — End: 2017-11-20

## 2017-11-20 MED ORDER — ONDANSETRON HCL 4 MG/2ML IJ SOLN
4.00 | INTRAMUSCULAR | Status: DC
Start: ? — End: 2017-11-20

## 2017-11-20 MED ORDER — ALBUTEROL SULFATE (5 MG/ML) 0.5% IN NEBU
2.50 | INHALATION_SOLUTION | RESPIRATORY_TRACT | Status: DC
Start: ? — End: 2017-11-20

## 2017-11-20 MED ORDER — METOPROLOL TARTRATE 25 MG PO TABS
25.00 | ORAL_TABLET | ORAL | Status: DC
Start: 2017-11-21 — End: 2017-11-20

## 2017-11-20 MED ORDER — GENERIC EXTERNAL MEDICATION
500.00 | Status: DC
Start: 2017-11-21 — End: 2017-11-20

## 2017-11-20 MED ORDER — GENERIC EXTERNAL MEDICATION
1.00 | Status: DC
Start: 2017-11-25 — End: 2017-11-20

## 2017-11-20 MED ORDER — VANCOMYCIN HCL IN NACL 1-0.9 GM/250ML-% IV SOLN
1.00 | INTRAVENOUS | Status: DC
Start: 2017-11-21 — End: 2017-11-20

## 2017-11-23 MED ORDER — LIDOCAINE HCL 1 % IJ SOLN
0.50 | INTRAMUSCULAR | Status: DC
Start: ? — End: 2017-11-23

## 2017-11-23 MED ORDER — LISINOPRIL 20 MG PO TABS
20.00 | ORAL_TABLET | ORAL | Status: DC
Start: 2017-11-26 — End: 2017-11-23

## 2017-11-23 MED ORDER — EZETIMIBE 10 MG PO TABS
10.00 | ORAL_TABLET | ORAL | Status: DC
Start: 2017-11-26 — End: 2017-11-23

## 2017-11-23 MED ORDER — POLYETHYLENE GLYCOL 3350 17 G PO PACK
17.00 | PACK | ORAL | Status: DC
Start: 2017-11-26 — End: 2017-11-23

## 2017-11-23 MED ORDER — METOPROLOL TARTRATE 25 MG PO TABS
25.00 | ORAL_TABLET | ORAL | Status: DC
Start: 2017-11-23 — End: 2017-11-23

## 2017-11-23 MED ORDER — ASPIRIN EC 81 MG PO TBEC
81.00 | DELAYED_RELEASE_TABLET | ORAL | Status: DC
Start: 2017-11-26 — End: 2017-11-23

## 2017-11-23 MED ORDER — METOPROLOL SUCCINATE ER 50 MG PO TB24
50.00 | ORAL_TABLET | ORAL | Status: DC
Start: 2017-11-24 — End: 2017-11-23

## 2017-11-23 MED ORDER — SUCRALFATE 1 G PO TABS
1.00 | ORAL_TABLET | ORAL | Status: DC
Start: 2017-11-25 — End: 2017-11-23

## 2017-11-23 MED ORDER — SENNOSIDES-DOCUSATE SODIUM 8.6-50 MG PO TABS
2.00 | ORAL_TABLET | ORAL | Status: DC
Start: 2017-11-25 — End: 2017-11-23

## 2017-11-27 MED ORDER — ENOXAPARIN SODIUM 40 MG/0.4ML ~~LOC~~ SOLN
40.00 | SUBCUTANEOUS | Status: DC
Start: 2017-11-26 — End: 2017-11-27

## 2017-11-27 MED ORDER — PANTOPRAZOLE SODIUM 40 MG PO TBEC
40.00 | DELAYED_RELEASE_TABLET | ORAL | Status: DC
Start: 2017-11-25 — End: 2017-11-27

## 2017-11-27 MED ORDER — METOPROLOL SUCCINATE ER 50 MG PO TB24
100.00 | ORAL_TABLET | ORAL | Status: DC
Start: 2017-11-26 — End: 2017-11-27

## 2019-08-25 IMAGING — CT CT RENAL STONE PROTOCOL
2 of 4 series · 16 of 46 positions shown, 18 images · non-contrast
Comparison: 01/22/2011

CLINICAL DATA: LEFT flank pain since 1000 hours today, suspected
stone disease

EXAM:
CT ABDOMEN AND PELVIS WITHOUT CONTRAST
TECHNIQUE: Multidetector CT imaging of the abdomen and pelvis was performed
following the standard protocol without IV contrast. Sagittal and
coronal MPR images reconstructed from axial data set. Oral contrast
not administered for this indication.

[Series 2: stone full standard · axial · 0.80mm/px · z∈[-460,-20]mm · 13 of 98 slices shown, 15 images]
[im 5/98  soft-tissue]
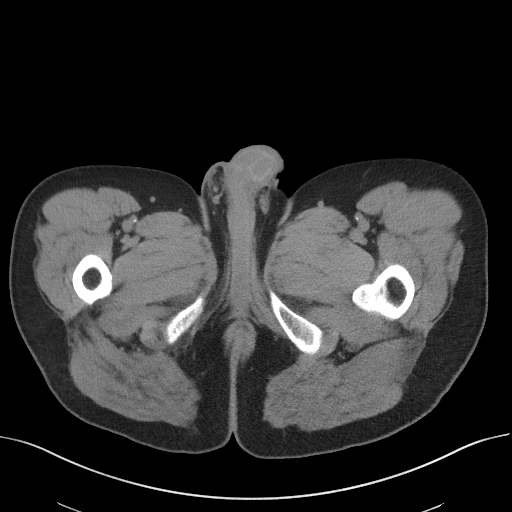
[im 5/98  bone]
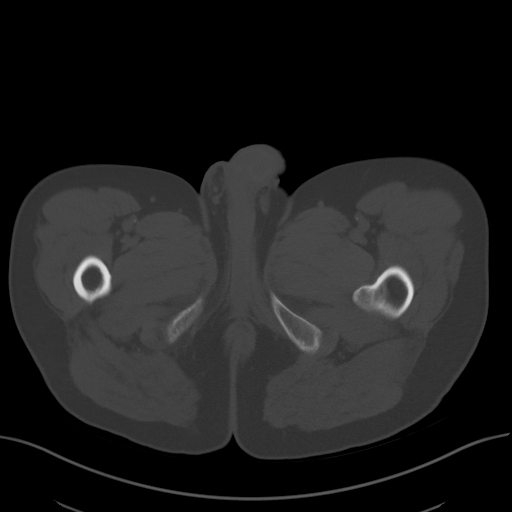
[im 13/98  soft-tissue]
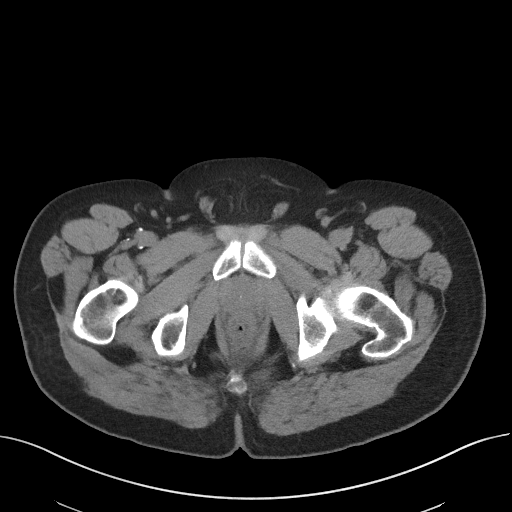
[im 21/98  soft-tissue]
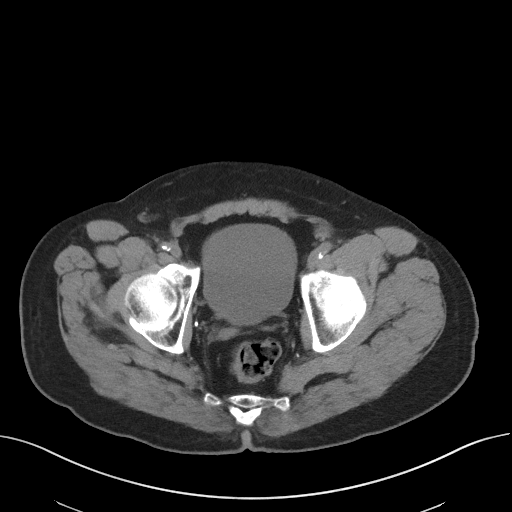
[im 29/98  soft-tissue]
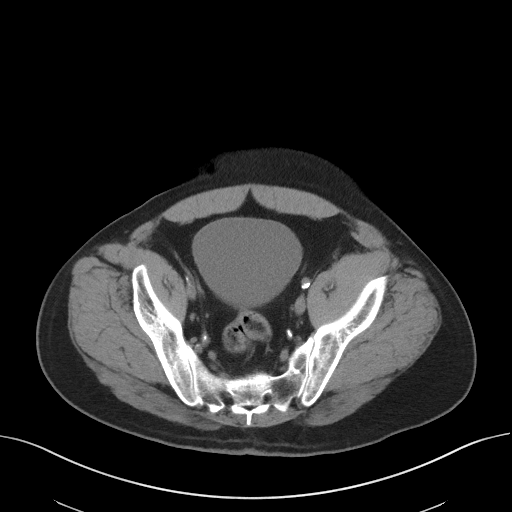
[im 33/98  soft-tissue]
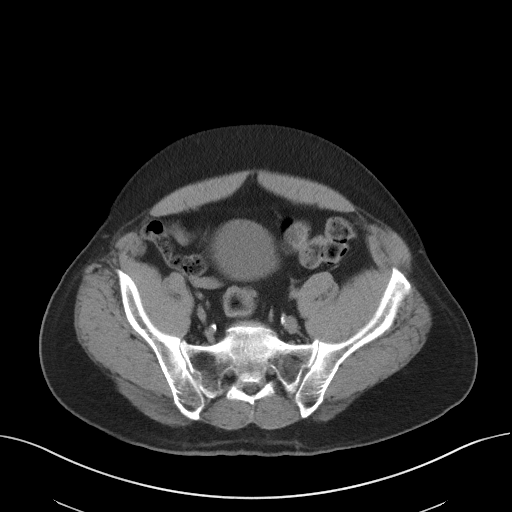
[im 41/98  soft-tissue]
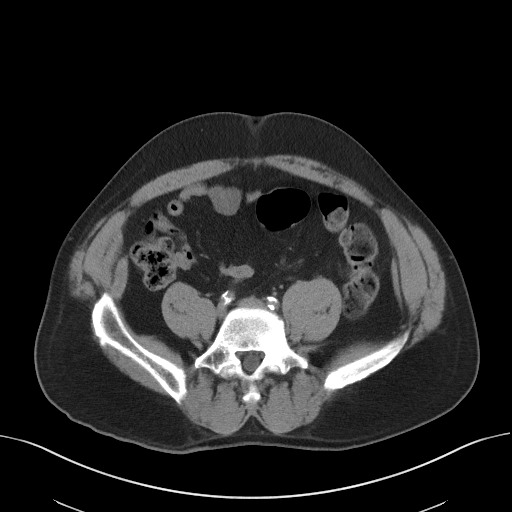
[im 49/98  soft-tissue]
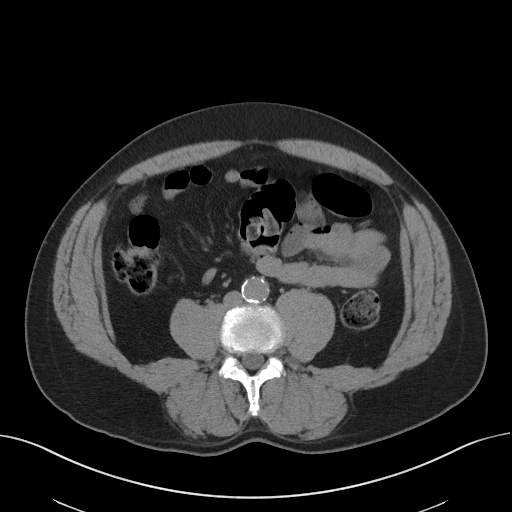
[im 57/98  soft-tissue]
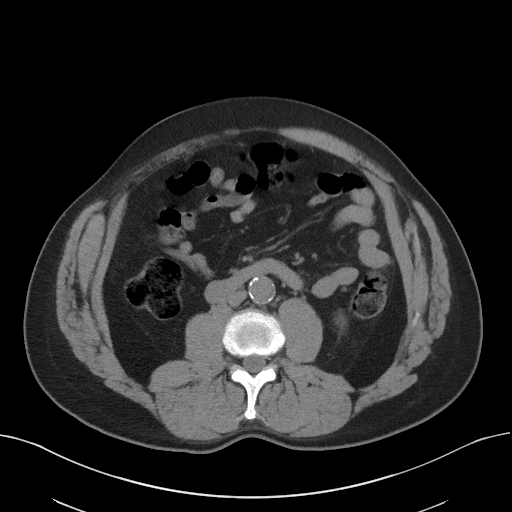
[im 65/98  soft-tissue]
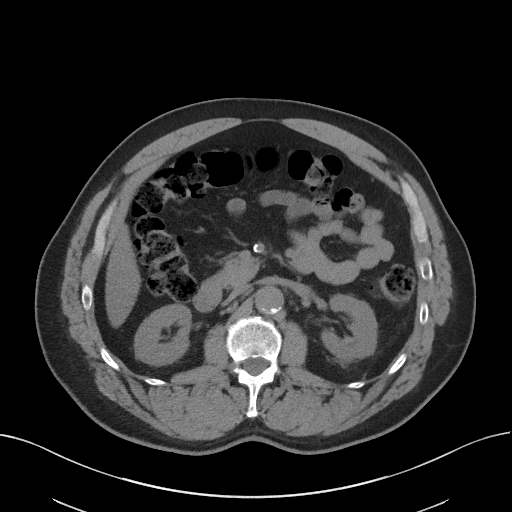
[im 65/98  bone]
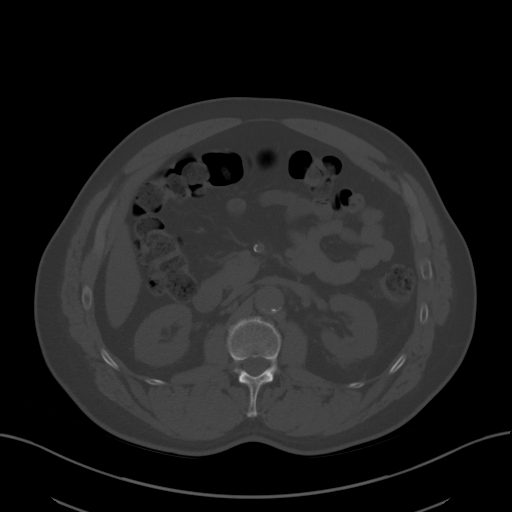
[im 69/98  soft-tissue]
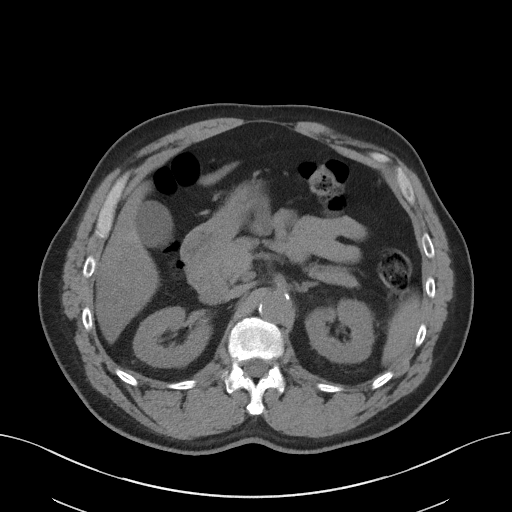
[im 77/98  soft-tissue]
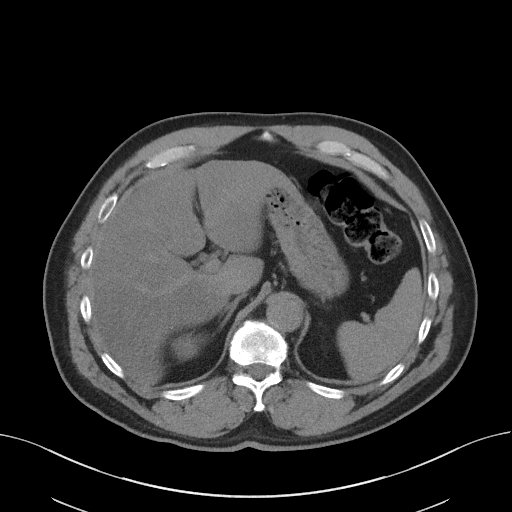
[im 85/98  soft-tissue]
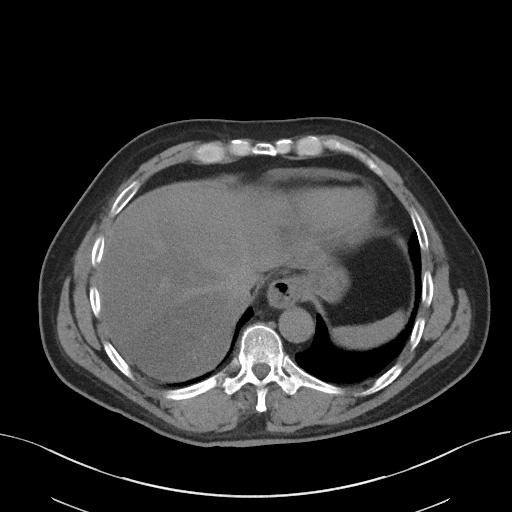
[im 93/98  soft-tissue]
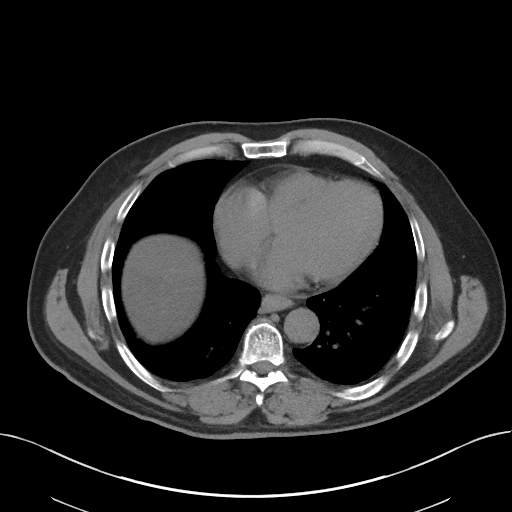

[Series 5: coronal · coronal · 0.73mm/px · 3 of 143 slices shown]
[im 48/143  soft-tissue]
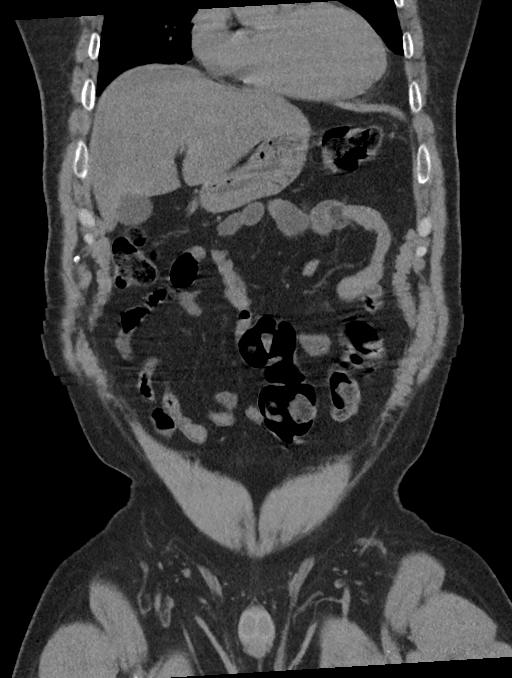
[im 64/143  soft-tissue]
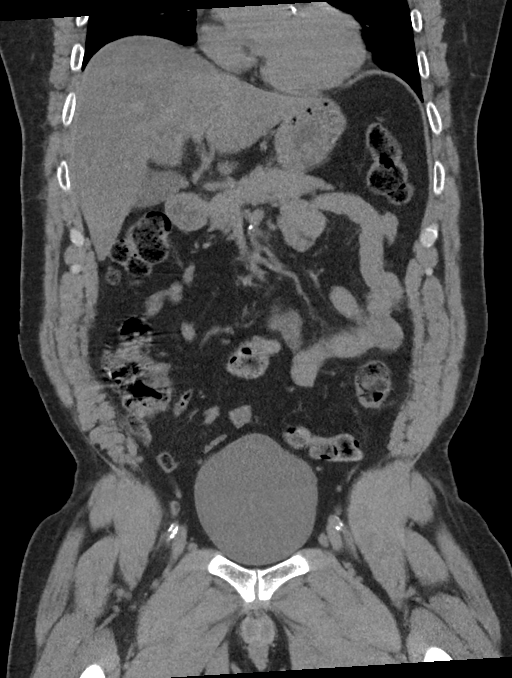
[im 79/143  soft-tissue]
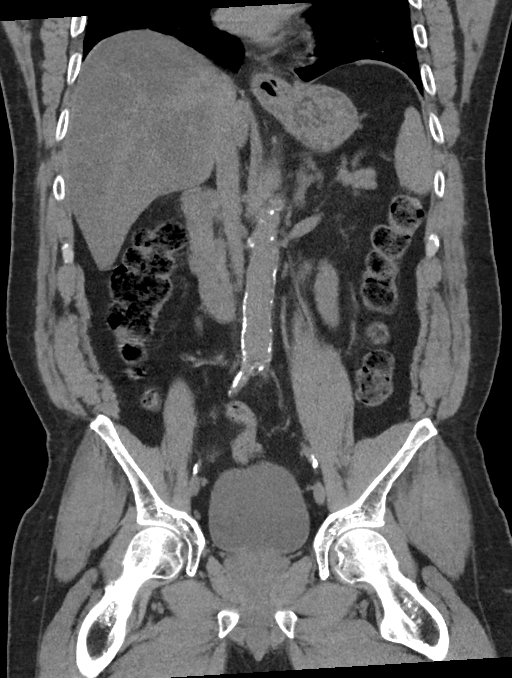

[16 of 46 positions shown; findings below may reference images not displayed]

FINDINGS: Lower chest: Peribronchial thickening with minimal dependent
atelectasis at lung bases

Hepatobiliary: Diffuse fatty infiltration of liver with focal
sparing adjacent to gallbladder fossa. Gallbladder unremarkable. No
biliary dilatation.

Pancreas: Normal appearance

Spleen: Normal appearance

Adrenals/Urinary Tract: Adrenal glands normal appearance. Minimal
perinephric stranding of LEFT kidney versus RIGHT without collecting
system dilatation, nonspecific but can be seen with infection. No
renal mass lesion, hydronephrosis or ureteral dilatation. No urinary
tract calcification. Bladder unremarkable.

Stomach/Bowel: Appendix not visualized. No pericecal inflammatory
process. Small hiatal hernia. Stomach and bowel loops otherwise
normal appearance for technique.

Vascular/Lymphatic: Atherosclerotic changes throughout abdomen and
pelvis including abdominal aorta and coronary arteries. Aorta normal
caliber. No adenopathy. Scattered pelvic phleboliths.

Reproductive: Prostatic enlargement, gland measuring 5.2 x 3.8 x
cm (volume = 43 cm^3).

Other: No free air or free fluid. Tiny umbilical hernia containing
fat. No inflammatory process.

Musculoskeletal: No acute osseous findings.  Bulging disc at L5-S1.
IMPRESSION: Fatty infiltration of liver.

Minimal stranding of perinephric fat, asymmetric versus RIGHT,
nonspecific but can be seen with pyelonephritis section fraction;
recommend correlation with urinalysis.

Small hiatal hernia.

Prostatic enlargement.

## 2020-05-22 IMAGING — DX DG CHEST 1V PORT
2 series · 2 of 2 positions shown · non-contrast
Comparison: 04/14/2016

CLINICAL DATA: Intubation.

EXAM:
PORTABLE CHEST 1 VIEW

[chest ap (1 of 2)]
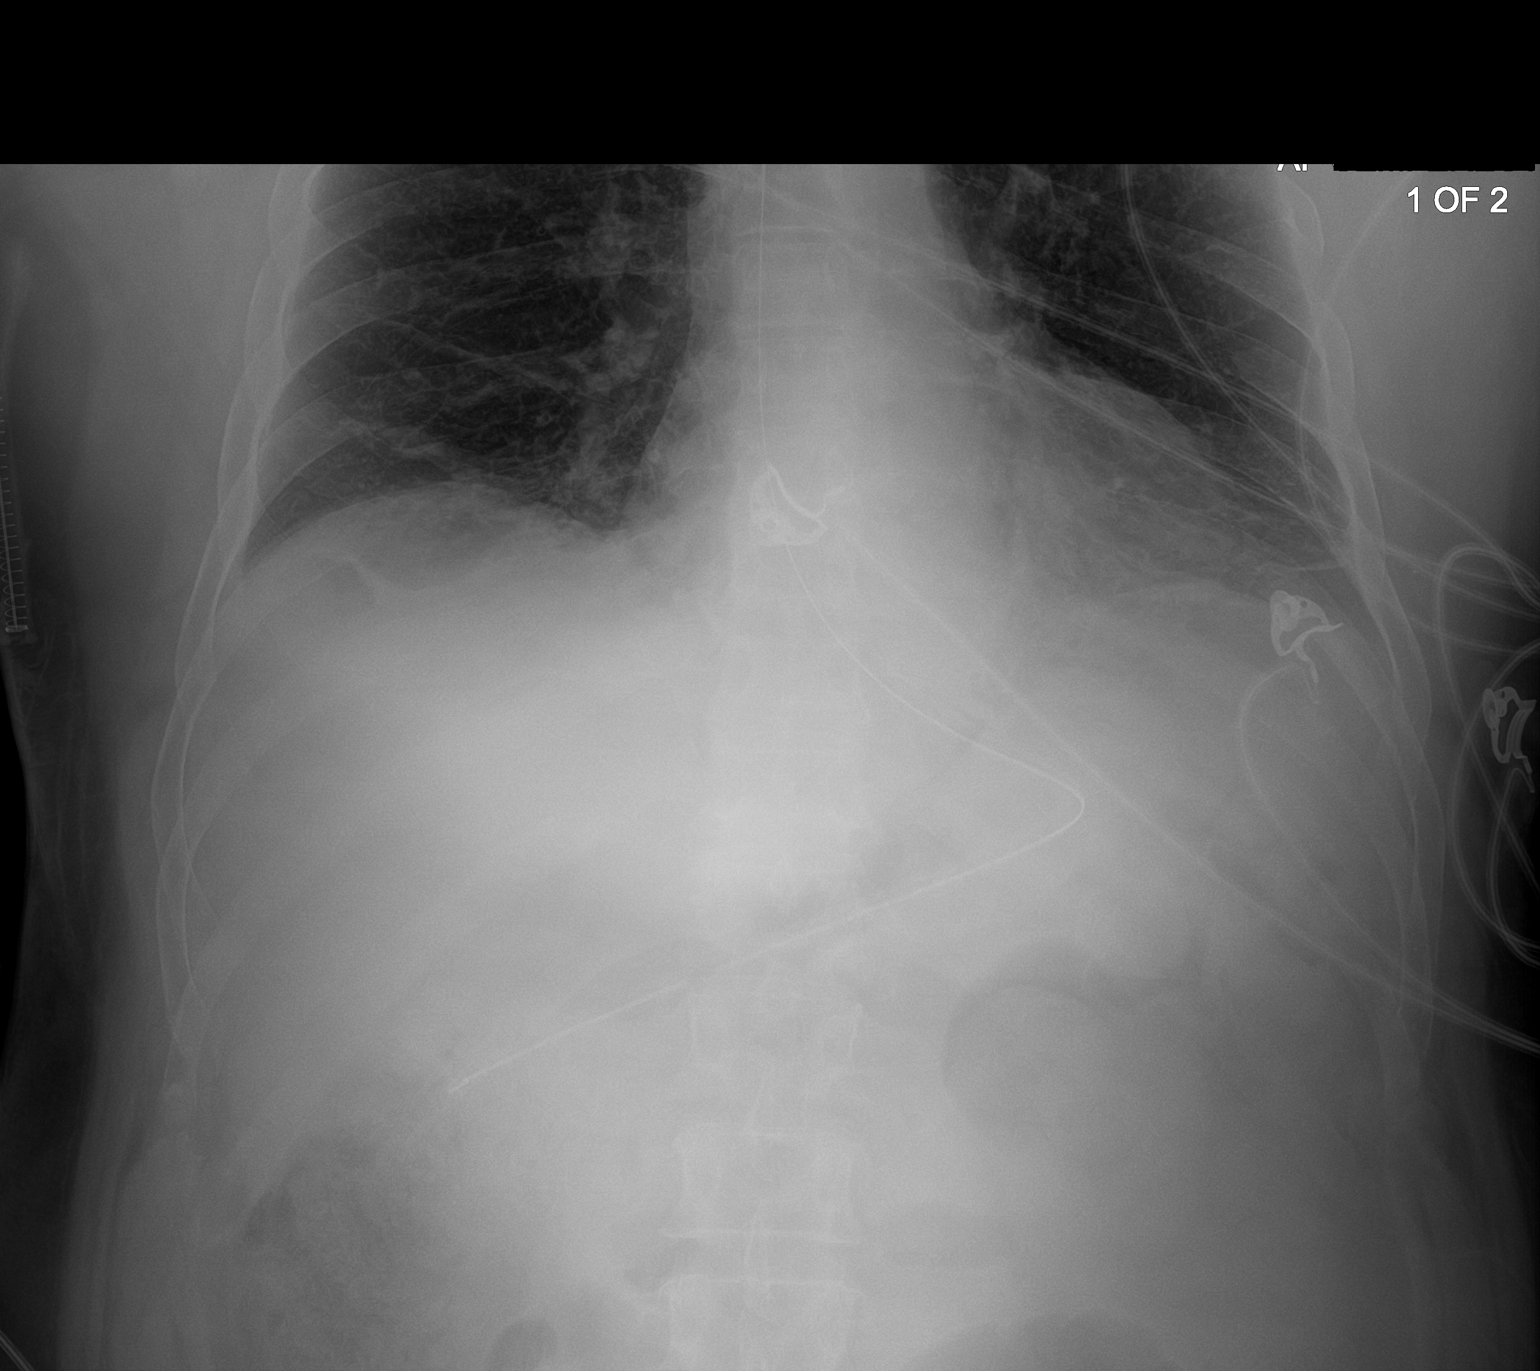

[chest ap (2 of 2)]
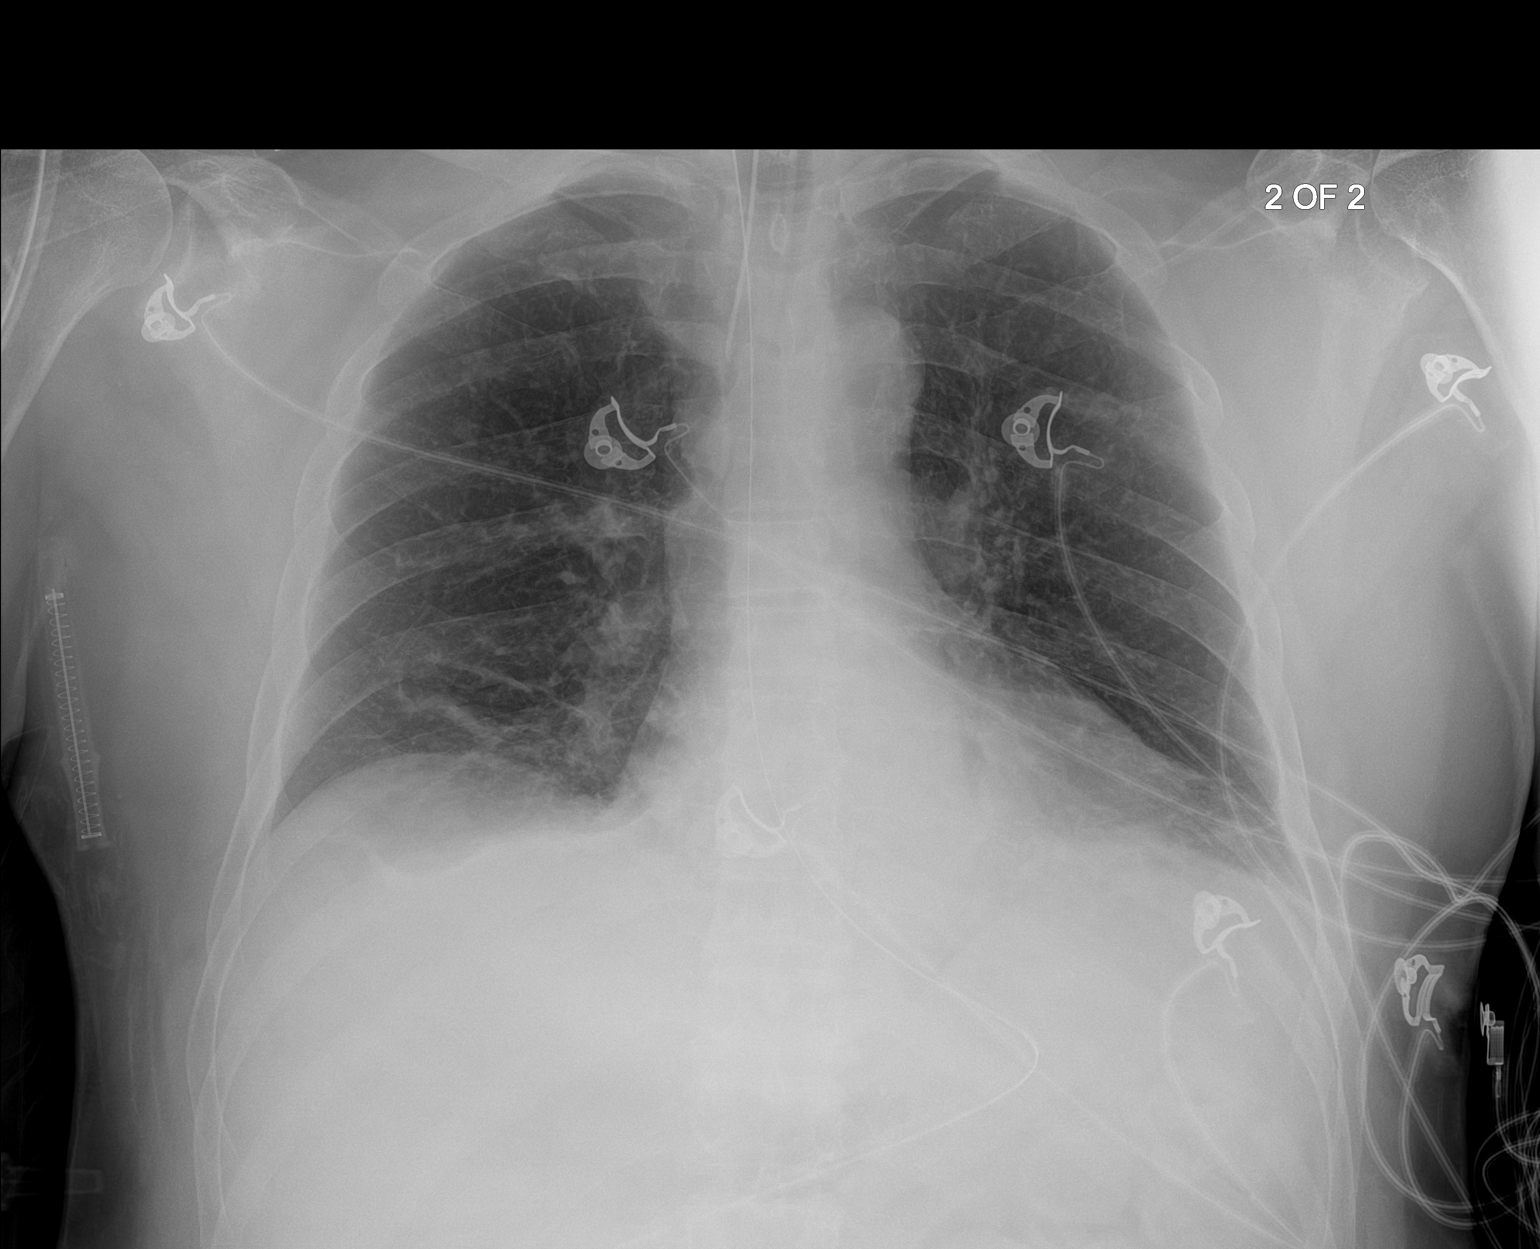

[2 of 2 positions shown; findings below may reference images not displayed]

FINDINGS: Endotracheal tube tip 4.4 cm from the carina. Enteric tube tip and
side-port below the diaphragm in the stomach. Small bore catheter
overlies the right supraclavicular region. Upper normal heart size
with unchanged mediastinal contours. Chronic interstitial
coarsening. Bibasilar atelectasis without confluent airspace
disease. No large pleural effusion. No pneumothorax. Unchanged
osseous structures. Presumed external artifact in the right axilla
(may be a pen).
IMPRESSION: 1. Endotracheal tube tip 4.4 cm from the carina. Enteric tube tip
and side-port below the diaphragm in the stomach.
2. Streaky bibasilar atelectasis.
3. Chronic interstitial coarsening.
